# Patient Record
Sex: Male | Born: 1985 | Race: White | Hispanic: No | Marital: Single | Smoking: Former smoker
Health system: Southern US, Community
[De-identification: ages and names within clinical notes are randomized; demographics above are authoritative.]

## PROBLEM LIST (undated history)

## (undated) DIAGNOSIS — D649 Anemia, unspecified: Secondary | ICD-10-CM

## (undated) HISTORY — PX: OTHER SURGICAL HISTORY: SHX169

## (undated) HISTORY — PX: HERNIA REPAIR: SHX51

---

## 2017-03-10 ENCOUNTER — Ambulatory Visit (INDEPENDENT_AMBULATORY_CARE_PROVIDER_SITE_OTHER): Payer: BLUE CROSS/BLUE SHIELD | Admitting: Orthopaedic Surgery

## 2017-03-10 ENCOUNTER — Encounter (INDEPENDENT_AMBULATORY_CARE_PROVIDER_SITE_OTHER): Payer: Self-pay | Admitting: Orthopaedic Surgery

## 2017-03-10 ENCOUNTER — Other Ambulatory Visit (INDEPENDENT_AMBULATORY_CARE_PROVIDER_SITE_OTHER): Payer: Self-pay

## 2017-03-10 VITALS — BP 100/63 | HR 65 | Ht 69.0 in | Wt 150.0 lb

## 2017-03-10 DIAGNOSIS — M79671 Pain in right foot: Secondary | ICD-10-CM

## 2017-03-10 NOTE — Progress Notes (Signed)
   Office Visit Note   Patient: Derrick Baker           Date of Birth: [birth year]           MRN: [id number] Visit Date: 03/10/2017              Requested by: Louie Bostonapper, David B., MD [address] [place], [place] [id number] PCP: Louie Bostonapper, David B., MD   Assessment & Plan: Visit Diagnoses:  1. Right foot pain   Nondisplaced Jones fracture base of right fifth metatarsal We will place in equalizer boot. Maintain out of work until this Sunday and follow-up in 2 weeks with repeat films. Discussed the nature of the fracture and what she may expect over the next 6-8 weeks  Follow-Up Instructions: Return in about 2 weeks (around 03/24/2017), or if symptoms worsen or fail to improve.   Orders:  No orders of the defined types were placed in this encounter.  No orders of the defined types were placed in this encounter.     Procedures: No procedures performed   Clinical Data: No additional findings.   Subjective: Chief Complaint  Patient presents with  . Right Foot - Pain, Fracture    Derrick Baker is a 31 y o that presents with R Foot fx when he dropped a weight on it Sunday. [facility].  Derrick Baker is 31 years old and had somewhat insidious onset of pain in his right foot several weeks ago after cutting the grass. He had more acute pain over the weekend when he dropped an object on his right foot. He was seen in the emergency room at [facility] with x-rays consistent with a Jones fracture. He was given a note to stay out of work until he was reevaluated and placed in a wooden shoe. Presently he notes he fairly comfortable. Patient is a nonsmoker I did review his films on the PACS system many has a nondisplaced true Jones fracture.  HPI  Review of Systems   Objective: Vital Signs: BP 100/63   Pulse 65   Ht 5\' 9"  (1.753 m)   Wt 150 lb (68 kg)   BMI 22.15 kg/m   Physical Exam  Ortho Exam right foot with minimal swelling at the base of the fifth metatarsal.  Mild discomfort to palpation in the same area. Skin intact. Neurovascular exam intact. No ankle pain.  Specialty Comments:  No specialty comments available.  Imaging: No results found.   PMFS History: There are no active problems to display for this patient.  No past medical history on file.  No family history on file.  Past Surgical History:  Procedure Laterality Date  . HERNIA REPAIR    . OTHER SURGICAL HISTORY    . OTHER SURGICAL HISTORY     Social History   Occupational History  . Not on file.   Social History Main Topics  . Smoking status: Never Smoker  . Smokeless tobacco: Never Used  . Alcohol use 0.6 oz/week    1 Glasses of wine per week  . Drug use: No  . Sexual activity: Not on file     Valeria BatmanPeter W Izel Hochberg, MD   Note - This record has been created using [employer] software.  Chart creation errors have been sought, but may not always  have been located. Such creation errors do not reflect on  the standard of medical care.

## 2017-03-11 ENCOUNTER — Telehealth: Payer: Self-pay

## 2017-03-11 NOTE — Telephone Encounter (Signed)
Patient would like a note to release him back to work with NO restrictions.  Please Advise.  CB# is [phone].

## 2017-03-11 NOTE — Telephone Encounter (Signed)
Ok to do

## 2017-03-11 NOTE — Telephone Encounter (Signed)
Derrick Baker faxed note per pt

## 2017-03-11 NOTE — Telephone Encounter (Signed)
OK 

## 2017-03-31 ENCOUNTER — Ambulatory Visit (INDEPENDENT_AMBULATORY_CARE_PROVIDER_SITE_OTHER): Payer: Self-pay

## 2017-03-31 ENCOUNTER — Ambulatory Visit (INDEPENDENT_AMBULATORY_CARE_PROVIDER_SITE_OTHER): Payer: BLUE CROSS/BLUE SHIELD | Admitting: Orthopaedic Surgery

## 2017-03-31 DIAGNOSIS — M79671 Pain in right foot: Secondary | ICD-10-CM

## 2017-03-31 NOTE — Progress Notes (Signed)
   Office Visit Note   Patient: Derrick Baker           Date of Birth: [birth year]           MRN: [id number] Visit Date: 03/31/2017              Requested by: Louie Bostonapper, David B., MD [address] [place], [place] [id number] PCP: Louie Bostonapper, David B., MD   Assessment & Plan: Visit Diagnoses:  1. Right foot pain   Jones fracture fifth metatarsal right foot  Plan: Continue immobilization with either the wooden shoe wear the equalizer boot. Return to the office in 3 weeks for repeat films. Okay to continue working  Follow-Up Instructions: Return in about 3 weeks (around 04/21/2017).   Orders:  No orders of the defined types were placed in this encounter.  No orders of the defined types were placed in this encounter.     Procedures: No procedures performed   Clinical Data: No additional findings.   Subjective: No chief complaint on file. Mr. Derrick Baker is about 3 weeks status post nondisplaced Jones fracture base of the right fifth metatarsal. 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 He has been using a wooden shoe. Surprisingly he's doing quite well without a  "whole lot of pain". Denies any numbness or tingling or significant swelling.  HPI  Review of Systems   Objective: Vital Signs: There were no vitals taken for this visit.  Physical Exam  Ortho Exam exam in his shoe reveals very minimal tenderness at the base of the right fifth metatarsal. He walks without a limp. Able to get up and down easily from low seating position without a problem.  Specialty Comments:  No specialty comments available.  Imaging: No results found.   PMFS History: There are no active problems to display for this patient.  No past medical history on file.  No family history on file.  Past Surgical History:  Procedure Laterality Date  . HERNIA REPAIR    . OTHER SURGICAL HISTORY    . OTHER SURGICAL HISTORY      Social History   Occupational History  . Not on file.   Social History Main Topics  . Smoking status: Never Smoker  . Smokeless tobacco: Never Used  . Alcohol use 0.6 oz/week    1 Glasses of wine per week  . Drug use: No  . Sexual activity: Not on file     Valeria BatmanPeter W Laddie Naeem, MD   Note - This record has been created using [employer] software.  Chart creation errors have been sought, but may not always  have been located. Such creation errors do not reflect on  the standard of medical care.

## 2017-04-21 ENCOUNTER — Ambulatory Visit (INDEPENDENT_AMBULATORY_CARE_PROVIDER_SITE_OTHER): Payer: BLUE CROSS/BLUE SHIELD | Admitting: Orthopaedic Surgery

## 2019-12-10 ENCOUNTER — Inpatient Hospital Stay (HOSPITAL_COMMUNITY): Payer: BC Managed Care – PPO | Admitting: Anesthesiology

## 2019-12-10 ENCOUNTER — Emergency Department (HOSPITAL_COMMUNITY): Payer: BC Managed Care – PPO

## 2019-12-10 ENCOUNTER — Encounter (HOSPITAL_COMMUNITY): Admission: EM | Disposition: A | Discharge status: Home Health | Payer: Self-pay | Source: Home / Self Care

## 2019-12-10 ENCOUNTER — Inpatient Hospital Stay (HOSPITAL_COMMUNITY)
Admission: EM | Admit: 2019-12-10 | Discharge: 2019-12-18 | DRG: 957 | Disposition: A | Discharge status: Home Health | Payer: BC Managed Care – PPO | Attending: General Surgery | Admitting: General Surgery

## 2019-12-10 ENCOUNTER — Other Ambulatory Visit: Payer: Self-pay

## 2019-12-10 ENCOUNTER — Encounter (HOSPITAL_COMMUNITY): Payer: Self-pay | Admitting: Radiology

## 2019-12-10 DIAGNOSIS — S27321A Contusion of lung, unilateral, initial encounter: Secondary | ICD-10-CM | POA: Diagnosis present

## 2019-12-10 DIAGNOSIS — S82121C Displaced fracture of lateral condyle of right tibia, initial encounter for open fracture type IIIA, IIIB, or IIIC: Secondary | ICD-10-CM | POA: Diagnosis present

## 2019-12-10 DIAGNOSIS — S82491C Other fracture of shaft of right fibula, initial encounter for open fracture type IIIA, IIIB, or IIIC: Secondary | ICD-10-CM | POA: Diagnosis present

## 2019-12-10 DIAGNOSIS — S92515A Nondisplaced fracture of proximal phalanx of left lesser toe(s), initial encounter for closed fracture: Secondary | ICD-10-CM | POA: Diagnosis present

## 2019-12-10 DIAGNOSIS — S82001C Unspecified fracture of right patella, initial encounter for open fracture type IIIA, IIIB, or IIIC: Secondary | ICD-10-CM | POA: Diagnosis present

## 2019-12-10 DIAGNOSIS — F10120 Alcohol abuse with intoxication, uncomplicated: Secondary | ICD-10-CM | POA: Diagnosis present

## 2019-12-10 DIAGNOSIS — D62 Acute posthemorrhagic anemia: Secondary | ICD-10-CM | POA: Diagnosis not present

## 2019-12-10 DIAGNOSIS — Z23 Encounter for immunization: Secondary | ICD-10-CM

## 2019-12-10 DIAGNOSIS — M898X9 Other specified disorders of bone, unspecified site: Secondary | ICD-10-CM | POA: Diagnosis present

## 2019-12-10 DIAGNOSIS — R609 Edema, unspecified: Secondary | ICD-10-CM

## 2019-12-10 DIAGNOSIS — S82191C Other fracture of upper end of right tibia, initial encounter for open fracture type IIIA, IIIB, or IIIC: Secondary | ICD-10-CM

## 2019-12-10 DIAGNOSIS — Y92410 Unspecified street and highway as the place of occurrence of the external cause: Secondary | ICD-10-CM | POA: Diagnosis not present

## 2019-12-10 DIAGNOSIS — Z880 Allergy status to penicillin: Secondary | ICD-10-CM

## 2019-12-10 DIAGNOSIS — S12601A Unspecified nondisplaced fracture of seventh cervical vertebra, initial encounter for closed fracture: Secondary | ICD-10-CM | POA: Diagnosis present

## 2019-12-10 DIAGNOSIS — Z419 Encounter for procedure for purposes other than remedying health state, unspecified: Secondary | ICD-10-CM

## 2019-12-10 DIAGNOSIS — S92301A Fracture of unspecified metatarsal bone(s), right foot, initial encounter for closed fracture: Secondary | ICD-10-CM | POA: Diagnosis present

## 2019-12-10 DIAGNOSIS — S7290XA Unspecified fracture of unspecified femur, initial encounter for closed fracture: Secondary | ICD-10-CM | POA: Diagnosis present

## 2019-12-10 DIAGNOSIS — S52271A Monteggia's fracture of right ulna, initial encounter for closed fracture: Secondary | ICD-10-CM | POA: Diagnosis present

## 2019-12-10 DIAGNOSIS — S92324A Nondisplaced fracture of second metatarsal bone, right foot, initial encounter for closed fracture: Secondary | ICD-10-CM | POA: Diagnosis present

## 2019-12-10 DIAGNOSIS — F10920 Alcohol use, unspecified with intoxication, uncomplicated: Secondary | ICD-10-CM

## 2019-12-10 DIAGNOSIS — S8991XA Unspecified injury of right lower leg, initial encounter: Secondary | ICD-10-CM | POA: Diagnosis not present

## 2019-12-10 DIAGNOSIS — E559 Vitamin D deficiency, unspecified: Secondary | ICD-10-CM | POA: Diagnosis present

## 2019-12-10 DIAGNOSIS — R52 Pain, unspecified: Secondary | ICD-10-CM

## 2019-12-10 DIAGNOSIS — T1490XA Injury, unspecified, initial encounter: Secondary | ICD-10-CM

## 2019-12-10 DIAGNOSIS — S129XXA Fracture of neck, unspecified, initial encounter: Secondary | ICD-10-CM

## 2019-12-10 DIAGNOSIS — U071 COVID-19: Secondary | ICD-10-CM | POA: Diagnosis present

## 2019-12-10 DIAGNOSIS — S52091A Other fracture of upper end of right ulna, initial encounter for closed fracture: Secondary | ICD-10-CM

## 2019-12-10 DIAGNOSIS — Y908 Blood alcohol level of 240 mg/100 ml or more: Secondary | ICD-10-CM | POA: Diagnosis present

## 2019-12-10 DIAGNOSIS — S80212A Abrasion, left knee, initial encounter: Secondary | ICD-10-CM | POA: Diagnosis present

## 2019-12-10 DIAGNOSIS — S2241XA Multiple fractures of ribs, right side, initial encounter for closed fracture: Secondary | ICD-10-CM | POA: Diagnosis present

## 2019-12-10 DIAGNOSIS — T148XXA Other injury of unspecified body region, initial encounter: Secondary | ICD-10-CM

## 2019-12-10 DIAGNOSIS — J939 Pneumothorax, unspecified: Secondary | ICD-10-CM

## 2019-12-10 DIAGNOSIS — R339 Retention of urine, unspecified: Secondary | ICD-10-CM | POA: Diagnosis not present

## 2019-12-10 DIAGNOSIS — S72414C Nondisplaced unspecified condyle fracture of lower end of right femur, initial encounter for open fracture type IIIA, IIIB, or IIIC: Secondary | ICD-10-CM

## 2019-12-10 DIAGNOSIS — S270XXA Traumatic pneumothorax, initial encounter: Principal | ICD-10-CM

## 2019-12-10 DIAGNOSIS — Z4682 Encounter for fitting and adjustment of non-vascular catheter: Secondary | ICD-10-CM

## 2019-12-10 DIAGNOSIS — S82831C Other fracture of upper and lower end of right fibula, initial encounter for open fracture type IIIA, IIIB, or IIIC: Secondary | ICD-10-CM

## 2019-12-10 DIAGNOSIS — S82001B Unspecified fracture of right patella, initial encounter for open fracture type I or II: Secondary | ICD-10-CM | POA: Diagnosis present

## 2019-12-10 DIAGNOSIS — S82091C Other fracture of right patella, initial encounter for open fracture type IIIA, IIIB, or IIIC: Secondary | ICD-10-CM

## 2019-12-10 HISTORY — PX: ORIF ULNAR FRACTURE: SHX5417

## 2019-12-10 HISTORY — PX: I & D EXTREMITY: SHX5045

## 2019-12-10 HISTORY — PX: CLOSED REDUCTION RADIAL SHAFT: SHX5008

## 2019-12-10 LAB — COMPREHENSIVE METABOLIC PANEL
ALT: 342 U/L — ABNORMAL HIGH (ref 0–44)
AST: 467 U/L — ABNORMAL HIGH (ref 15–41)
Albumin: 3.5 g/dL (ref 3.5–5.0)
Alkaline Phosphatase: 69 U/L (ref 38–126)
Anion gap: 14 (ref 5–15)
BUN: 11 mg/dL (ref 6–20)
CO2: 23 mmol/L (ref 22–32)
Calcium: 8.3 mg/dL — ABNORMAL LOW (ref 8.9–10.3)
Chloride: 104 mmol/L (ref 98–111)
Creatinine, Ser: 0.95 mg/dL (ref 0.61–1.24)
GFR calc Af Amer: >60 mL/min (ref >60)
GFR calc non Af Amer: >60 mL/min (ref >60)
Glucose, Bld: 143 mg/dL — ABNORMAL HIGH (ref 70–99)
Potassium: 3.6 mmol/L (ref 3.5–5.1)
Sodium: 141 mmol/L (ref 135–145)
Total Bilirubin: 0.4 mg/dL (ref 0.3–1.2)
Total Protein: 6.5 g/dL (ref 6.5–8.1)

## 2019-12-10 LAB — LACTIC ACID, PLASMA
Lactic Acid, Venous: 3.4 mmol/L (ref 0.5–1.9)
Lactic Acid, Venous: 3.4 mmol/L (ref 0.5–1.9)

## 2019-12-10 LAB — CBC
HCT: 41 % (ref 39.0–52.0)
Hemoglobin: 13.6 g/dL (ref 13.0–17.0)
MCH: 29.3 pg (ref 26.0–34.0)
MCHC: 33.2 g/dL (ref 30.0–36.0)
MCV: 88.4 fL (ref 80.0–100.0)
Platelets: 247 10*3/uL (ref 150–400)
RBC: 4.64 MIL/uL (ref 4.22–5.81)
RDW: 12.6 % (ref 11.5–15.5)
WBC: 15.8 10*3/uL — ABNORMAL HIGH (ref 4.0–10.5)
nRBC: 0 % (ref 0.0–0.2)

## 2019-12-10 LAB — I-STAT CHEM 8, ED
BUN: 11 mg/dL (ref 6–20)
Calcium, Ion: 1.01 mmol/L — ABNORMAL LOW (ref 1.15–1.40)
Chloride: 104 mmol/L (ref 98–111)
Creatinine, Ser: 1.2 mg/dL (ref 0.61–1.24)
Glucose, Bld: 136 mg/dL — ABNORMAL HIGH (ref 70–99)
HCT: 38 % — ABNORMAL LOW (ref 39.0–52.0)
Hemoglobin: 12.9 g/dL — ABNORMAL LOW (ref 13.0–17.0)
Potassium: 3.6 mmol/L (ref 3.5–5.1)
Sodium: 142 mmol/L (ref 135–145)
TCO2: 26 mmol/L (ref 22–32)

## 2019-12-10 LAB — RESPIRATORY PANEL BY RT PCR (FLU A&B, COVID)
Influenza A by PCR: NEGATIVE
Influenza B by PCR: NEGATIVE
SARS Coronavirus 2 by RT PCR: POSITIVE — AB

## 2019-12-10 LAB — ETHANOL: Alcohol, Ethyl (B): 269 mg/dL — ABNORMAL HIGH (ref <10)

## 2019-12-10 LAB — SAMPLE TO BLOOD BANK

## 2019-12-10 LAB — PROTIME-INR
INR: 1 (ref 0.8–1.2)
Prothrombin Time: 13.3 seconds (ref 11.4–15.2)

## 2019-12-10 LAB — HIV ANTIBODY (ROUTINE TESTING W REFLEX): HIV Screen 4th Generation wRfx: NONREACTIVE

## 2019-12-10 SURGERY — OPEN REDUCTION INTERNAL FIXATION (ORIF) ULNAR FRACTURE
Anesthesia: General | Laterality: Right

## 2019-12-10 MED ORDER — FENTANYL CITRATE (PF) 100 MCG/2ML IJ SOLN
50.0000 ug | Freq: Once | INTRAMUSCULAR | Status: AC
  Administered 2019-12-10: 16:00:00 50 ug via INTRAVENOUS
  Filled 2019-12-10: qty 2

## 2019-12-10 MED ORDER — TETANUS-DIPHTH-ACELL PERTUSSIS 5-2.5-18.5 LF-MCG/0.5 IM SUSP
0.5000 mL | Freq: Once | INTRAMUSCULAR | Status: AC
  Administered 2019-12-10: 0.5 mL via INTRAMUSCULAR

## 2019-12-10 MED ORDER — ONDANSETRON HCL 4 MG/2ML IJ SOLN
INTRAMUSCULAR | Status: DC | PRN
  Administered 2019-12-10: 4 mg via INTRAVENOUS

## 2019-12-10 MED ORDER — SODIUM CHLORIDE 0.9 % IV BOLUS
1000.0000 mL | Freq: Once | INTRAVENOUS | Status: AC
  Administered 2019-12-10: 1000 mL via INTRAVENOUS

## 2019-12-10 MED ORDER — METHOCARBAMOL 500 MG PO TABS
1000.0000 mg | ORAL_TABLET | Freq: Three times a day (TID) | ORAL | Status: DC
  Administered 2019-12-11 – 2019-12-18 (×22): 1000 mg via ORAL
  Filled 2019-12-10 (×22): qty 2

## 2019-12-10 MED ORDER — IOHEXOL 300 MG/ML  SOLN
100.0000 mL | Freq: Once | INTRAMUSCULAR | Status: AC | PRN
  Administered 2019-12-10: 100 mL via INTRAVENOUS

## 2019-12-10 MED ORDER — PHENYLEPHRINE HCL-NACL 10-0.9 MG/250ML-% IV SOLN
INTRAVENOUS | Status: DC | PRN
  Administered 2019-12-10: 25 ug/min via INTRAVENOUS

## 2019-12-10 MED ORDER — LACTATED RINGERS IV SOLN: INTRAVENOUS | Status: DC | PRN

## 2019-12-10 MED ORDER — ENOXAPARIN SODIUM 30 MG/0.3ML ~~LOC~~ SOLN
30.0000 mg | Freq: Two times a day (BID) | SUBCUTANEOUS | Status: DC
  Administered 2019-12-11 – 2019-12-17 (×14): 30 mg via SUBCUTANEOUS
  Filled 2019-12-10 (×15): qty 0.3

## 2019-12-10 MED ORDER — LACTATED RINGERS IV BOLUS
1000.0000 mL | Freq: Once | INTRAVENOUS | Status: AC
  Administered 2019-12-11: 1000 mL via INTRAVENOUS

## 2019-12-10 MED ORDER — KETOROLAC TROMETHAMINE 15 MG/ML IJ SOLN
15.0000 mg | Freq: Four times a day (QID) | INTRAMUSCULAR | Status: AC | PRN
  Administered 2019-12-11 – 2019-12-15 (×12): 15 mg via INTRAVENOUS
  Filled 2019-12-10 (×12): qty 1

## 2019-12-10 MED ORDER — ALBUMIN HUMAN 5 % IV SOLN: INTRAVENOUS | Status: DC | PRN

## 2019-12-10 MED ORDER — ACETAMINOPHEN 500 MG PO TABS
1000.0000 mg | ORAL_TABLET | Freq: Four times a day (QID) | ORAL | Status: DC
  Administered 2019-12-11 – 2019-12-18 (×28): 1000 mg via ORAL
  Filled 2019-12-10 (×29): qty 2

## 2019-12-10 MED ORDER — SODIUM CHLORIDE 0.9 % IV SOLN
2.0000 g | INTRAVENOUS | Status: AC
  Administered 2019-12-10: 21:00:00 2 g via INTRAVENOUS
  Filled 2019-12-10: qty 2
  Filled 2019-12-10: qty 20

## 2019-12-10 MED ORDER — ROCURONIUM BROMIDE 50 MG/5ML IV SOSY
PREFILLED_SYRINGE | INTRAVENOUS | Status: DC | PRN
  Administered 2019-12-10 (×2): 20 mg via INTRAVENOUS
  Administered 2019-12-10: 30 mg via INTRAVENOUS
  Administered 2019-12-10: 20 mg via INTRAVENOUS
  Administered 2019-12-10: 50 mg via INTRAVENOUS
  Administered 2019-12-10: 20 mg via INTRAVENOUS

## 2019-12-10 MED ORDER — SODIUM CHLORIDE 0.9 % IV SOLN: INTRAVENOUS | Status: DC

## 2019-12-10 MED ORDER — FENTANYL CITRATE (PF) 250 MCG/5ML IJ SOLN
INTRAMUSCULAR | Status: AC
  Filled 2019-12-10: qty 5

## 2019-12-10 MED ORDER — MORPHINE SULFATE (PF) 4 MG/ML IV SOLN
4.0000 mg | INTRAVENOUS | Status: DC | PRN
  Administered 2019-12-11: 2 mg via INTRAVENOUS
  Administered 2019-12-11: 1 mg via INTRAVENOUS
  Administered 2019-12-11 (×2): 4 mg via INTRAVENOUS
  Administered 2019-12-11: 1 mg via INTRAVENOUS
  Administered 2019-12-11 – 2019-12-18 (×16): 4 mg via INTRAVENOUS
  Filled 2019-12-10 (×19): qty 1

## 2019-12-10 MED ORDER — SUCCINYLCHOLINE CHLORIDE 20 MG/ML IJ SOLN
INTRAMUSCULAR | Status: DC | PRN
  Administered 2019-12-10: 100 mg via INTRAVENOUS

## 2019-12-10 MED ORDER — PROPOFOL 10 MG/ML IV BOLUS
INTRAVENOUS | Status: DC | PRN
  Administered 2019-12-10: 150 mg via INTRAVENOUS

## 2019-12-10 MED ORDER — DEXAMETHASONE SODIUM PHOSPHATE 10 MG/ML IJ SOLN
INTRAMUSCULAR | Status: DC | PRN
  Administered 2019-12-10: 5 mg via INTRAVENOUS

## 2019-12-10 MED ORDER — ETOMIDATE 2 MG/ML IV SOLN
10.0000 mg | Freq: Once | INTRAVENOUS | Status: AC
  Administered 2019-12-10: 10 mg via INTRAVENOUS
  Filled 2019-12-10: qty 10

## 2019-12-10 MED ORDER — 0.9 % SODIUM CHLORIDE (POUR BTL) OPTIME
TOPICAL | Status: DC | PRN
  Administered 2019-12-10: 1000 mL

## 2019-12-10 MED ORDER — DOCUSATE SODIUM 100 MG PO CAPS
100.0000 mg | ORAL_CAPSULE | Freq: Two times a day (BID) | ORAL | Status: DC
  Administered 2019-12-11 – 2019-12-18 (×14): 100 mg via ORAL
  Filled 2019-12-10 (×14): qty 1

## 2019-12-10 MED ORDER — ONDANSETRON HCL 4 MG/2ML IJ SOLN
4.0000 mg | Freq: Once | INTRAMUSCULAR | Status: AC
  Administered 2019-12-10: 4 mg via INTRAVENOUS
  Filled 2019-12-10: qty 2

## 2019-12-10 MED ORDER — ONDANSETRON HCL 4 MG/2ML IJ SOLN: 4.0000 mg | Freq: Four times a day (QID) | INTRAMUSCULAR | Status: DC | PRN

## 2019-12-10 MED ORDER — STERILE WATER FOR IRRIGATION IR SOLN
Status: DC | PRN
  Administered 2019-12-10 (×3): 1000 mL

## 2019-12-10 MED ORDER — ONDANSETRON 4 MG PO TBDP: 4.0000 mg | ORAL_TABLET | Freq: Four times a day (QID) | ORAL | Status: DC | PRN

## 2019-12-10 MED ORDER — CEFAZOLIN SODIUM-DEXTROSE 2-4 GM/100ML-% IV SOLN
2.0000 g | INTRAVENOUS | Status: AC
  Administered 2019-12-10: 2 g via INTRAVENOUS
  Filled 2019-12-10: qty 100

## 2019-12-10 MED ORDER — SODIUM CHLORIDE 0.9 % IR SOLN
Status: DC | PRN
  Administered 2019-12-10 (×3): 3000 mL

## 2019-12-10 MED ORDER — PROPOFOL 10 MG/ML IV BOLUS
INTRAVENOUS | Status: AC
  Filled 2019-12-10: qty 20

## 2019-12-10 MED ORDER — MIDAZOLAM HCL 2 MG/2ML IJ SOLN
INTRAMUSCULAR | Status: AC
  Filled 2019-12-10: qty 2

## 2019-12-10 MED ORDER — TETANUS-DIPHTH-ACELL PERTUSSIS 5-2.5-18.5 LF-MCG/0.5 IM SUSP
0.5000 mL | Freq: Once | INTRAMUSCULAR | Status: DC
  Filled 2019-12-10: qty 0.5

## 2019-12-10 MED ORDER — LACTATED RINGERS IV SOLN
INTRAVENOUS | Status: DC
  Administered 2019-12-12: 16:00:00 1000 mL via INTRAVENOUS

## 2019-12-10 MED ORDER — FENTANYL CITRATE (PF) 100 MCG/2ML IJ SOLN
INTRAMUSCULAR | Status: DC | PRN
  Administered 2019-12-10 – 2019-12-11 (×7): 50 ug via INTRAVENOUS

## 2019-12-10 MED ORDER — OXYCODONE HCL 5 MG/5ML PO SOLN
5.0000 mg | ORAL | Status: DC | PRN
  Administered 2019-12-11 – 2019-12-18 (×33): 10 mg via ORAL
  Filled 2019-12-10 (×33): qty 10

## 2019-12-10 MED ORDER — FENTANYL CITRATE (PF) 100 MCG/2ML IJ SOLN
50.0000 ug | Freq: Once | INTRAMUSCULAR | Status: AC
  Administered 2019-12-10: 18:00:00 50 ug via INTRAVENOUS
  Filled 2019-12-10: qty 2

## 2019-12-10 SURGICAL SUPPLY — 115 items
BIT DRILL 2.3 QUICK RELEASE (BIT) ×1 IMPLANT
BIT DRILL 2.8 QUICK RELEASE (BIT) ×1 IMPLANT
BLADE CLIPPER SURG (BLADE) ×3 IMPLANT
BLADE SURG 10 STRL SS (BLADE) ×6 IMPLANT
BNDG COHESIVE 4X5 TAN STRL (GAUZE/BANDAGES/DRESSINGS) ×3 IMPLANT
BNDG COHESIVE 6X5 TAN STRL LF (GAUZE/BANDAGES/DRESSINGS) ×3 IMPLANT
BNDG ELASTIC 3X5.8 VLCR STR LF (GAUZE/BANDAGES/DRESSINGS) ×3 IMPLANT
BNDG ELASTIC 4X5.8 VLCR STR LF (GAUZE/BANDAGES/DRESSINGS) ×6 IMPLANT
BNDG ELASTIC 6X5.8 VLCR STR LF (GAUZE/BANDAGES/DRESSINGS) ×3 IMPLANT
BNDG ESMARK 4X9 LF (GAUZE/BANDAGES/DRESSINGS) ×3 IMPLANT
BNDG GAUZE ELAST 4 BULKY (GAUZE/BANDAGES/DRESSINGS) ×6 IMPLANT
BRUSH SCRUB EZ  4% CHG (MISCELLANEOUS) ×6
BRUSH SCRUB EZ 4% CHG (MISCELLANEOUS) ×3 IMPLANT
BRUSH SCRUB EZ PLAIN DRY (MISCELLANEOUS) ×6 IMPLANT
BUCKET CAST 5QT PAPER WAX WHT (CAST SUPPLIES) ×3 IMPLANT
COVER SURGICAL LIGHT HANDLE (MISCELLANEOUS) ×6 IMPLANT
COVER WAND RF STERILE (DRAPES) IMPLANT
DECANTER SPIKE VIAL GLASS SM (MISCELLANEOUS) IMPLANT
DRAPE C-ARM 42X72 X-RAY (DRAPES) ×3 IMPLANT
DRAPE C-ARMOR (DRAPES) ×3 IMPLANT
DRAPE HALF SHEET 40X57 (DRAPES) ×3 IMPLANT
DRAPE OEC MINIVIEW 54X84 (DRAPES) ×3 IMPLANT
DRAPE U-SHAPE 47X51 STRL (DRAPES) ×3 IMPLANT
DRILL 2.3 QUICK RELEASE (BIT) ×3
DRILL 2.8 QUICK RELEASE (BIT) ×3
DRSG ADAPTIC 3X8 NADH LF (GAUZE/BANDAGES/DRESSINGS) ×3 IMPLANT
DRSG EMULSION OIL 3X3 NADH (GAUZE/BANDAGES/DRESSINGS) ×3 IMPLANT
DRSG MEPITEL 4X7.2 (GAUZE/BANDAGES/DRESSINGS) ×6 IMPLANT
DRSG PAD ABDOMINAL 8X10 ST (GAUZE/BANDAGES/DRESSINGS) ×3 IMPLANT
DRSG VERAFLO VAC MED (GAUZE/BANDAGES/DRESSINGS) ×3 IMPLANT
DRSG VERSA FOAM LRG 10X15 (GAUZE/BANDAGES/DRESSINGS) ×3 IMPLANT
ELECT BLADE 4.0 EZ CLEAN MEGAD (MISCELLANEOUS) ×3
ELECT REM PT RETURN 9FT ADLT (ELECTROSURGICAL) ×3
ELECTRODE BLDE 4.0 EZ CLN MEGD (MISCELLANEOUS) ×1 IMPLANT
ELECTRODE REM PT RTRN 9FT ADLT (ELECTROSURGICAL) ×1 IMPLANT
EVACUATOR 1/8 PVC DRAIN (DRAIN) ×3 IMPLANT
GAUZE SPONGE 4X4 12PLY STRL (GAUZE/BANDAGES/DRESSINGS) ×6 IMPLANT
GLOVE BIO SURGEON STRL SZ7 (GLOVE) ×3 IMPLANT
GLOVE BIO SURGEON STRL SZ7.5 (GLOVE) ×9 IMPLANT
GLOVE BIO SURGEON STRL SZ8 (GLOVE) ×9 IMPLANT
GLOVE BIOGEL PI IND STRL 7.0 (GLOVE) ×1 IMPLANT
GLOVE BIOGEL PI IND STRL 7.5 (GLOVE) ×2 IMPLANT
GLOVE BIOGEL PI IND STRL 8 (GLOVE) ×2 IMPLANT
GLOVE BIOGEL PI IND STRL 9 (GLOVE) ×1 IMPLANT
GLOVE BIOGEL PI INDICATOR 7.0 (GLOVE) ×2
GLOVE BIOGEL PI INDICATOR 7.5 (GLOVE) ×4
GLOVE BIOGEL PI INDICATOR 8 (GLOVE) ×4
GLOVE BIOGEL PI INDICATOR 9 (GLOVE) ×2
GOWN STRL REUS W/ TWL LRG LVL3 (GOWN DISPOSABLE) ×2 IMPLANT
GOWN STRL REUS W/ TWL XL LVL3 (GOWN DISPOSABLE) ×1 IMPLANT
GOWN STRL REUS W/TWL LRG LVL3 (GOWN DISPOSABLE) ×4
GOWN STRL REUS W/TWL XL LVL3 (GOWN DISPOSABLE) ×2
GUIDEWIRE ORTH 6X062XTROC NS (WIRE) ×2 IMPLANT
GUIDEWIRE ORTHO 2.0X9 ST (WIRE) ×3 IMPLANT
HANDPIECE INTERPULSE COAX TIP (DISPOSABLE)
IMMOBILIZER KNEE 22 UNIV (SOFTGOODS) ×3 IMPLANT
K-WIRE .062 (WIRE) ×4
KIT BASIN OR (CUSTOM PROCEDURE TRAY) ×3 IMPLANT
KIT TURNOVER KIT B (KITS) ×3 IMPLANT
MANIFOLD NEPTUNE II (INSTRUMENTS) ×3 IMPLANT
NEEDLE HYPO 25GX1X1/2 BEV (NEEDLE) IMPLANT
NS IRRIG 1000ML POUR BTL (IV SOLUTION) ×6 IMPLANT
PACK ORTHO EXTREMITY (CUSTOM PROCEDURE TRAY) ×3 IMPLANT
PAD ARMBOARD 7.5X6 YLW CONV (MISCELLANEOUS) ×6 IMPLANT
PAD CAST 3X4 CTTN HI CHSV (CAST SUPPLIES) ×1 IMPLANT
PAD CAST 4YDX4 CTTN HI CHSV (CAST SUPPLIES) ×1 IMPLANT
PADDING CAST COTTON 3X4 STRL (CAST SUPPLIES) ×2
PADDING CAST COTTON 4X4 STRL (CAST SUPPLIES) ×2
PADDING CAST COTTON 6X4 STRL (CAST SUPPLIES) ×3 IMPLANT
PLATE OLECRANON RT 110 7H (Plate) ×1 IMPLANT
PLATE OLECRANON RT 7H (Plate) ×3 IMPLANT
SCREW 3.5X45MM (Screw) ×3 IMPLANT
SCREW HEX LOCK 2.7X16MM (Screw) ×6 IMPLANT
SCREW HEX NON LOCK 3.5X34MM (Screw) ×3 IMPLANT
SCREW HEXALOBE NON-LOCK 3.5X16 (Screw) ×3 IMPLANT
SCREW LOCK 18X2.7X HEXALOBE (Screw) ×2 IMPLANT
SCREW LOCK 20X2.7X HEXALOBE (Screw) ×1 IMPLANT
SCREW LOCK 22X2.7X HEXALOBE (Screw) ×1 IMPLANT
SCREW LOCKING 2.7X18MM (Screw) ×4 IMPLANT
SCREW LOCKING 2.7X20MM (Screw) ×2 IMPLANT
SCREW LOCKING 2.7X22MM (Screw) ×2 IMPLANT
SCREW LOCKING 3.0X34MM (Screw) ×3 IMPLANT
SCREW LOCKING 3.5X45MM (Screw) ×3 IMPLANT
SCREW NON LOCKING HEX 3.5X18MM (Screw) ×6 IMPLANT
SET HNDPC FAN SPRY TIP SCT (DISPOSABLE) IMPLANT
SLING ARM IMMOBILIZER LRG (SOFTGOODS) ×3 IMPLANT
SOL PREP POV-IOD 4OZ 10% (MISCELLANEOUS) ×3 IMPLANT
SOL PREP PROV IODINE SCRUB 4OZ (MISCELLANEOUS) ×3 IMPLANT
SOLUTION EZ SCRUB CHG (MISCELLANEOUS) ×3 IMPLANT
SPLINT PLASTER CAST XFAST 5X30 (CAST SUPPLIES) ×1 IMPLANT
SPLINT PLASTER XFAST SET 5X30 (CAST SUPPLIES) ×2
SPONGE LAP 18X18 RF (DISPOSABLE) ×6 IMPLANT
STAPLER VISISTAT 35W (STAPLE) ×6 IMPLANT
STOCKINETTE IMPERVIOUS 9X36 MD (GAUZE/BANDAGES/DRESSINGS) IMPLANT
SUCTION FRAZIER HANDLE 10FR (MISCELLANEOUS) ×2
SUCTION FRAZIER TIP 8 FR DISP (SUCTIONS) ×2
SUCTION TUBE FRAZIER 10FR DISP (MISCELLANEOUS) ×1 IMPLANT
SUCTION TUBE FRAZIER 8FR DISP (SUCTIONS) ×1 IMPLANT
SUT ETHILON 2 0 PSLX (SUTURE) ×9 IMPLANT
SUT PDS AB 0 CT 36 (SUTURE) ×3 IMPLANT
SUT PDS AB 2-0 CT1 27 (SUTURE) IMPLANT
SUT PROLENE 0 CT (SUTURE) IMPLANT
SUT VIC AB 0 CT1 27 (SUTURE) ×4
SUT VIC AB 0 CT1 27XBRD ANBCTR (SUTURE) ×2 IMPLANT
SUT VIC AB 2-0 CT1 27 (SUTURE) ×6
SUT VIC AB 2-0 CT1 TAPERPNT 27 (SUTURE) ×3 IMPLANT
SUT VIC AB 2-0 CT3 27 (SUTURE) IMPLANT
SYR CONTROL 10ML LL (SYRINGE) IMPLANT
TOWEL GREEN STERILE (TOWEL DISPOSABLE) ×6 IMPLANT
TOWEL GREEN STERILE FF (TOWEL DISPOSABLE) ×3 IMPLANT
TUBE CONNECTING 12'X1/4 (SUCTIONS) ×3
TUBE CONNECTING 12X1/4 (SUCTIONS) ×6 IMPLANT
UNDERPAD 30X30 (UNDERPADS AND DIAPERS) ×9 IMPLANT
WATER STERILE IRR 1000ML POUR (IV SOLUTION) ×6 IMPLANT
YANKAUER SUCT BULB TIP NO VENT (SUCTIONS) ×6 IMPLANT
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
## 2019-12-10 NOTE — Anesthesia Preprocedure Evaluation (Signed)
Anesthesia Evaluation  Patient identified by MRN, date of birth, ID band Patient awake    Reviewed: Allergy & Precautions, NPO status , Patient's Chart, lab work & pertinent test results  Airway Mallampati: II  TM Distance: >3 FB Neck ROM: Full    Dental  (+) Dental Advisory Given   Pulmonary  Right ptx s/p chest tube insertion with right rib fx's   breath sounds clear to auscultation       Cardiovascular negative cardio ROS   Rhythm:Regular Rate:Normal     Neuro/Psych negative neurological ROS     GI/Hepatic negative GI ROS, (+)     substance abuse  alcohol use, Elevated LFT's   Endo/Other  negative endocrine ROS  Renal/GU negative Renal ROS     Musculoskeletal   Abdominal   Peds  Hematology  (+) anemia ,   Anesthesia Other Findings Covid +  Reproductive/Obstetrics                             Lab Results  Component Value Date   WBC 15.8 (H) 12/10/2019   HGB 12.9 (L) 12/10/2019   HCT 38.0 (L) 12/10/2019   MCV 88.4 12/10/2019   PLT 247 12/10/2019   Lab Results  Component Value Date   CREATININE 1.20 12/10/2019   BUN 11 12/10/2019   NA 142 12/10/2019   K 3.6 12/10/2019   CL 104 12/10/2019   CO2 23 12/10/2019    Anesthesia Physical Anesthesia Plan  ASA: III and emergent  Anesthesia Plan: General   Post-op Pain Management:    Induction: Intravenous and Rapid sequence  PONV Risk Score and Plan: 2 and Dexamethasone, Ondansetron and Treatment may vary due to age or medical condition  Airway Management Planned: Oral ETT and Video Laryngoscope Planned  Additional Equipment:   Intra-op Plan:   Post-operative Plan: Extubation in OR  Informed Consent: I have reviewed the patients History and Physical, chart, labs and discussed the procedure including the risks, benefits and alternatives for the proposed anesthesia with the patient or authorized representative who has  indicated his/her understanding and acceptance.     Dental advisory given  Plan Discussed with: CRNA  Anesthesia Plan Comments:         Anesthesia Quick Evaluation
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
## 2019-12-10 NOTE — Progress Notes (Signed)
Orthopedic Tech Progress Note Patient Details:  Derrick Baker 04/28/1986 [id number]  Ortho Devices Type of Ortho Device: Ace wrap, Knee Immobilizer Ortho Device/Splint Location: Level 2 Trauma Ortho Device/Splint Interventions: Application       Saul Fordyce 12/10/2019, 4:33 PM

## 2019-12-10 NOTE — ED Notes (Signed)
EDP speaking with neuro.

## 2019-12-10 NOTE — Anesthesia Procedure Notes (Signed)
Procedure Name: Intubation Date/Time: 12/10/2019 8:01 PM Performed by: Edmonia Caprio, CRNA Pre-anesthesia Checklist: Patient identified, Emergency Drugs available, Suction available, Patient being monitored and Timeout performed Patient Re-evaluated:Patient Re-evaluated prior to induction Oxygen Delivery Method: Circle system utilized Preoxygenation: Pre-oxygenation with 100% oxygen Induction Type: IV induction and Rapid sequence Laryngoscope Size: Glidescope and 4 Grade View: Grade I Tube type: Oral Tube size: 7.5 mm Number of attempts: 1 Airway Equipment and Method: Stylet and Video-laryngoscopy Placement Confirmation: ETT inserted through vocal cords under direct vision,  positive ETCO2 and breath sounds checked- equal and bilateral Secured at: 22 cm Tube secured with: Tape Dental Injury: Teeth and Oropharynx as per pre-operative assessment  Comments: Initially c-collar left in place for induction, unable to get blade in mouth with c-collar on; manual in-line stabilization held for DL. Dried blood present in mouth/on teeth.  Oropharynx clear on DL. C-collar immediately replaced.

## 2019-12-11 ENCOUNTER — Inpatient Hospital Stay (HOSPITAL_COMMUNITY): Payer: BC Managed Care – PPO

## 2019-12-11 ENCOUNTER — Encounter (HOSPITAL_COMMUNITY): Payer: Self-pay

## 2019-12-11 DIAGNOSIS — S92301A Fracture of unspecified metatarsal bone(s), right foot, initial encounter for closed fracture: Secondary | ICD-10-CM | POA: Diagnosis present

## 2019-12-11 DIAGNOSIS — S8991XA Unspecified injury of right lower leg, initial encounter: Secondary | ICD-10-CM | POA: Diagnosis present

## 2019-12-11 DIAGNOSIS — S52271A Monteggia's fracture of right ulna, initial encounter for closed fracture: Secondary | ICD-10-CM

## 2019-12-11 HISTORY — DX: Monteggia's fracture of right ulna, initial encounter for closed fracture: S52.271A

## 2019-12-11 LAB — COMPREHENSIVE METABOLIC PANEL
ALT: 223 U/L — ABNORMAL HIGH (ref 0–44)
AST: 257 U/L — ABNORMAL HIGH (ref 15–41)
Albumin: 3 g/dL — ABNORMAL LOW (ref 3.5–5.0)
Alkaline Phosphatase: 47 U/L (ref 38–126)
Anion gap: 15 (ref 5–15)
BUN: 9 mg/dL (ref 6–20)
CO2: 25 mmol/L (ref 22–32)
Calcium: 7.3 mg/dL — ABNORMAL LOW (ref 8.9–10.3)
Chloride: 101 mmol/L (ref 98–111)
Creatinine, Ser: 0.98 mg/dL (ref 0.61–1.24)
GFR calc Af Amer: >60 mL/min (ref >60)
GFR calc non Af Amer: >60 mL/min (ref >60)
Glucose, Bld: 178 mg/dL — ABNORMAL HIGH (ref 70–99)
Potassium: 4.3 mmol/L (ref 3.5–5.1)
Sodium: 141 mmol/L (ref 135–145)
Total Bilirubin: 0.7 mg/dL (ref 0.3–1.2)
Total Protein: 5.5 g/dL — ABNORMAL LOW (ref 6.5–8.1)

## 2019-12-11 LAB — CBC
HCT: 31.6 % — ABNORMAL LOW (ref 39.0–52.0)
Hemoglobin: 10.3 g/dL — ABNORMAL LOW (ref 13.0–17.0)
MCH: 28.9 pg (ref 26.0–34.0)
MCHC: 32.6 g/dL (ref 30.0–36.0)
MCV: 88.5 fL (ref 80.0–100.0)
Platelets: 161 10*3/uL (ref 150–400)
RBC: 3.57 MIL/uL — ABNORMAL LOW (ref 4.22–5.81)
RDW: 13.1 % (ref 11.5–15.5)
WBC: 11.1 10*3/uL — ABNORMAL HIGH (ref 4.0–10.5)
nRBC: 0 % (ref 0.0–0.2)

## 2019-12-11 LAB — LACTIC ACID, PLASMA: Lactic Acid, Venous: 3.7 mmol/L (ref 0.5–1.9)

## 2019-12-11 LAB — VITAMIN D 25 HYDROXY (VIT D DEFICIENCY, FRACTURES): Vit D, 25-Hydroxy: 22.78 ng/mL — ABNORMAL LOW (ref 30–100)

## 2019-12-11 LAB — POCT I-STAT, CHEM 8
BUN: 9 mg/dL (ref 6–20)
Calcium, Ion: 1.06 mmol/L — ABNORMAL LOW (ref 1.15–1.40)
Chloride: 105 mmol/L (ref 98–111)
Creatinine, Ser: 1 mg/dL (ref 0.61–1.24)
Glucose, Bld: 122 mg/dL — ABNORMAL HIGH (ref 70–99)
HCT: 33 % — ABNORMAL LOW (ref 39.0–52.0)
Hemoglobin: 11.2 g/dL — ABNORMAL LOW (ref 13.0–17.0)
Potassium: 3.9 mmol/L (ref 3.5–5.1)
Sodium: 143 mmol/L (ref 135–145)
TCO2: 27 mmol/L (ref 22–32)

## 2019-12-11 MED ORDER — CHLORHEXIDINE GLUCONATE CLOTH 2 % EX PADS
6.0000 | MEDICATED_PAD | Freq: Every day | CUTANEOUS | Status: DC
  Administered 2019-12-12 – 2019-12-16 (×4): 6 via TOPICAL

## 2019-12-11 MED ORDER — LEVOFLOXACIN IN D5W 500 MG/100ML IV SOLN
500.0000 mg | INTRAVENOUS | Status: DC
  Administered 2019-12-11 – 2019-12-12 (×2): 500 mg via INTRAVENOUS
  Filled 2019-12-11 (×4): qty 100

## 2019-12-11 MED ORDER — SUGAMMADEX SODIUM 200 MG/2ML IV SOLN
INTRAVENOUS | Status: DC | PRN
  Administered 2019-12-11: 200 mg via INTRAVENOUS

## 2019-12-11 MED ORDER — VITAMIN D 25 MCG (1000 UNIT) PO TABS
2000.0000 [IU] | ORAL_TABLET | Freq: Two times a day (BID) | ORAL | Status: DC
  Administered 2019-12-11 – 2019-12-18 (×15): 2000 [IU] via ORAL
  Filled 2019-12-11 (×15): qty 2

## 2019-12-11 MED ORDER — GABAPENTIN 100 MG PO CAPS
100.0000 mg | ORAL_CAPSULE | Freq: Three times a day (TID) | ORAL | Status: DC
  Administered 2019-12-11 – 2019-12-18 (×22): 100 mg via ORAL
  Filled 2019-12-11 (×22): qty 1

## 2019-12-11 MED ORDER — CLINDAMYCIN PHOSPHATE 900 MG/50ML IV SOLN
900.0000 mg | Freq: Three times a day (TID) | INTRAVENOUS | Status: DC
  Administered 2019-12-11 – 2019-12-12 (×5): 900 mg via INTRAVENOUS
  Filled 2019-12-11 (×9): qty 50

## 2019-12-11 MED ORDER — ASCORBIC ACID 500 MG PO TABS
1000.0000 mg | ORAL_TABLET | Freq: Every day | ORAL | Status: DC
  Administered 2019-12-11 – 2019-12-18 (×8): 1000 mg via ORAL
  Filled 2019-12-11 (×8): qty 2

## 2019-12-11 NOTE — Anesthesia Postprocedure Evaluation (Signed)
Anesthesia Post Note  Patient: Derrick Baker  Procedure(s) Performed: OPEN REDUCTION INTERNAL FIXATION (ORIF) ULNAR FRACTURE (Right ) CLOSED REDUCTION RADIAL HEAD DISLOCATION (Right ) IRRIGATION AND DEBRIDEMENT OF RIGHT PATELLA AND APPLICATION OF WOUND VAC (Right )     Patient location during evaluation: PACU Anesthesia Type: General Level of consciousness: awake and alert Pain management: pain level controlled Vital Signs Assessment: post-procedure vital signs reviewed and stable Respiratory status: spontaneous breathing, nonlabored ventilation, respiratory function stable and patient connected to nasal cannula oxygen Cardiovascular status: blood pressure returned to baseline and stable Postop Assessment: no apparent nausea or vomiting Anesthetic complications: no    Last Vitals:  Vitals:   12/11/19 0600 12/11/19 0700  BP: 133/84   Pulse:  (!) 109  Resp:  15  Temp:    SpO2:  92%    Last Pain:  Vitals:   12/11/19 0400  TempSrc: Oral  PainSc: 5                  Kennieth Rad
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
## 2019-12-11 NOTE — Transfer of Care (Signed)
Immediate Anesthesia Transfer of Care Note  Patient: Derrick Baker  Procedure(s) Performed: OPEN REDUCTION INTERNAL FIXATION (ORIF) ULNAR FRACTURE (Right ) CLOSED REDUCTION RADIAL HEAD DISLOCATION (Right ) IRRIGATION AND DEBRIDEMENT OF RIGHT PATELLA AND APPLICATION OF WOUND VAC (Right )  Patient Location: ICU  Anesthesia Type:General  Level of Consciousness: awake and responds to stimulation  Airway & Oxygen Therapy: Patient Spontanous Breathing and Patient connected to face mask oxygen  Post-op Assessment: Report given to RN and Post -op Vital signs reviewed and stable  Post vital signs: Reviewed and stable  Last Vitals:  Vitals Value Taken Time  BP 139/80 12/11/19 0045  Temp    Pulse 122 12/11/19 0048  Resp 23 12/11/19 0048  SpO2 98 % 12/11/19 0048  Vitals shown include unvalidated device data.  Last Pain:  Vitals:   12/11/19 0030  TempSrc:   PainSc: (P) 5          Complications: No apparent anesthesia complications
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
## 2019-12-11 NOTE — Progress Notes (Signed)
Orthopedic Tech Progress Note Patient Details:  Derrick Baker November 29, 1985 [id number]  Ortho Devices Type of Ortho Device: Postop shoe/boot Ortho Device/Splint Location: RLE Ortho Device/Splint Interventions: Ordered, Application   Post Interventions Patient Tolerated: Well Instructions Provided: Care of device, Adjustment of device   Donald Pore 12/11/2019, 1:15 PM
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
## 2019-12-11 NOTE — Plan of Care (Signed)
  Problem: Education: Goal: Knowledge of risk factors and measures for prevention of condition will improve Outcome: Progressing   Problem: Coping: Goal: Psychosocial and spiritual needs will be supported Outcome: Progressing   Problem: Respiratory: Goal: Will maintain a patent airway Outcome: Progressing Goal: Complications related to the disease process, condition or treatment will be avoided or minimized Outcome: Progressing   
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
## 2019-12-11 NOTE — Brief Op Note (Signed)
12/10/2019 - 12/11/2019  12:23 AM  PATIENT:  Derrick Baker  34 y.o. male  PRE-OPERATIVE DIAGNOSIS:   1. OPEN RIGHT PATELLA FX, GRADE 3C 2. CLOSED MONTEGGIA FRACTURE DISLOCATION (COMMINUTED OLECRANON WITH EXTENSION INTO THE CORONOID AND DISLOCATION OF THE RADIAL HEAD) 3. TRAUMATIC ARTHROTOMY OF THE RIGHT KNEE  4. SUSPECTED QUADRICEPT TENDON INJURY RIGHT KNEE 5. RIGHT FOOT FRACTURES  POST-OPERATIVE DIAGNOSIS:   1. OPEN RIGHT PATELLA FX, GRADE 3C 2. CLOSED MONTEGGIA FRACTURE DISLOCATION (COMMINUTED OLECRANON WITH EXTENSION INTO THE CORONOID AND DISLOCATION OF THE RADIAL HEAD) 3. TRAUMATIC ARTHROTOMY OF THE RIGHT KNEE  4. INCOMPLETE TEAR OF THE QUADRICEPT TENDON RIGHT KNEE 5. RIGHT FOOT FRACTURES  PROCEDURE:  Procedure(s): 1. OPEN REDUCTION INTERNAL FIXATION (ORIF) ULNAR FRACTURE (Right) INCLUDING CORONOID 2. CLOSED REDUCTION RADIAL HEAD DISLOCATION (Right) 3. IRRIGATION AND DEBRIDEMENT OF TRAUMATIC ARTHROTOMY RIGHT KNEE WITH EXPLORATION OF PARTIAL QUADRICEPT TENDON TEAR 4.  IRRIGATION AND DEBRIDEMENT OF OPEN RIGHT PATELLA INCLUDING BONE 5. APPLICATION OF LARGE WOUND VAC (Right)  SURGEON:  Surgeon(s) and Role:    Myrene Galas, MD - Primary  PHYSICIAN ASSISTANT: Montez Morita, PA-C  ANESTHESIA:   general  EBL:  100 mL   BLOOD ADMINISTERED:none  DRAINS:  1. Hemovac in the knee joint (Traumatic arthrotomy) 2. Wound vac over the open patella (white and black foam)  LOCAL MEDICATIONS USED:  NONE  SPECIMEN:  No Specimen  DISPOSITION OF SPECIMEN:  N/A  COUNTS:  YES  TOURNIQUET:  * Missing tourniquet times found for documented tourniquets in log: [id number] *  DICTATION: .[id number]  PLAN OF CARE: Admit to inpatient   PATIENT DISPOSITION:  PACU - hemodynamically stable.   Delay start of Pharmacological VTE agent (>24hrs) due to surgical blood loss or risk of bleeding: no

## 2019-12-12 ENCOUNTER — Inpatient Hospital Stay (HOSPITAL_COMMUNITY): Payer: BC Managed Care – PPO

## 2019-12-12 ENCOUNTER — Inpatient Hospital Stay (HOSPITAL_COMMUNITY): Payer: BC Managed Care – PPO | Admitting: Anesthesiology

## 2019-12-12 ENCOUNTER — Encounter (HOSPITAL_COMMUNITY): Admission: EM | Disposition: A | Discharge status: Home Health | Payer: Self-pay | Source: Home / Self Care

## 2019-12-12 DIAGNOSIS — S8991XA Unspecified injury of right lower leg, initial encounter: Secondary | ICD-10-CM

## 2019-12-12 HISTORY — PX: APPLICATION OF WOUND VAC: SHX5189

## 2019-12-12 HISTORY — PX: I & D EXTREMITY: SHX5045

## 2019-12-12 LAB — URINALYSIS, ROUTINE W REFLEX MICROSCOPIC
Bacteria, UA: NONE SEEN
Bilirubin Urine: NEGATIVE
Glucose, UA: NEGATIVE mg/dL
Ketones, ur: NEGATIVE mg/dL
Leukocytes,Ua: NEGATIVE
Nitrite: NEGATIVE
Protein, ur: 30 mg/dL — AB
RBC / HPF: >50 RBC/hpf — ABNORMAL HIGH (ref 0–5)
Specific Gravity, Urine: 1.026 (ref 1.005–1.030)
pH: 6 (ref 5.0–8.0)

## 2019-12-12 LAB — CBC
HCT: 27 % — ABNORMAL LOW (ref 39.0–52.0)
Hemoglobin: 9 g/dL — ABNORMAL LOW (ref 13.0–17.0)
MCH: 29.7 pg (ref 26.0–34.0)
MCHC: 33.3 g/dL (ref 30.0–36.0)
MCV: 89.1 fL (ref 80.0–100.0)
Platelets: 139 10*3/uL — ABNORMAL LOW (ref 150–400)
RBC: 3.03 MIL/uL — ABNORMAL LOW (ref 4.22–5.81)
RDW: 13.1 % (ref 11.5–15.5)
WBC: 9.7 10*3/uL (ref 4.0–10.5)
nRBC: 0 % (ref 0.0–0.2)

## 2019-12-12 LAB — SURGICAL PCR SCREEN
MRSA, PCR: NEGATIVE
Staphylococcus aureus: POSITIVE — AB

## 2019-12-12 LAB — LACTIC ACID, PLASMA
Lactic Acid, Venous: 1.5 mmol/L (ref 0.5–1.9)
Lactic Acid, Venous: 1.7 mmol/L (ref 0.5–1.9)

## 2019-12-12 LAB — BASIC METABOLIC PANEL
Anion gap: 7 (ref 5–15)
BUN: 12 mg/dL (ref 6–20)
CO2: 29 mmol/L (ref 22–32)
Calcium: 7.9 mg/dL — ABNORMAL LOW (ref 8.9–10.3)
Chloride: 101 mmol/L (ref 98–111)
Creatinine, Ser: 0.79 mg/dL (ref 0.61–1.24)
GFR calc Af Amer: >60 mL/min (ref >60)
GFR calc non Af Amer: >60 mL/min (ref >60)
Glucose, Bld: 131 mg/dL — ABNORMAL HIGH (ref 70–99)
Potassium: 4 mmol/L (ref 3.5–5.1)
Sodium: 137 mmol/L (ref 135–145)

## 2019-12-12 SURGERY — IRRIGATION AND DEBRIDEMENT EXTREMITY
Anesthesia: General | Site: Knee | Laterality: Right

## 2019-12-12 MED ORDER — MIDAZOLAM HCL 2 MG/2ML IJ SOLN
INTRAMUSCULAR | Status: AC
  Filled 2019-12-12: qty 2

## 2019-12-12 MED ORDER — VANCOMYCIN HCL 1000 MG IV SOLR
INTRAVENOUS | Status: DC | PRN
  Administered 2019-12-12: 1000 mg

## 2019-12-12 MED ORDER — MUPIROCIN 2 % EX OINT
1.0000 "application " | TOPICAL_OINTMENT | Freq: Two times a day (BID) | CUTANEOUS | Status: AC
  Administered 2019-12-12 – 2019-12-16 (×9): 1 via NASAL
  Filled 2019-12-12 (×5): qty 22

## 2019-12-12 MED ORDER — PROMETHAZINE HCL 25 MG/ML IJ SOLN: 6.2500 mg | INTRAMUSCULAR | Status: DC | PRN

## 2019-12-12 MED ORDER — PROPOFOL 10 MG/ML IV BOLUS
INTRAVENOUS | Status: DC | PRN
  Administered 2019-12-12: 160 mg via INTRAVENOUS

## 2019-12-12 MED ORDER — FENTANYL CITRATE (PF) 250 MCG/5ML IJ SOLN
INTRAMUSCULAR | Status: AC
  Filled 2019-12-12: qty 5

## 2019-12-12 MED ORDER — FENTANYL CITRATE (PF) 100 MCG/2ML IJ SOLN
INTRAMUSCULAR | Status: AC
  Administered 2019-12-12: 50 ug via INTRAVENOUS
  Filled 2019-12-12: qty 2

## 2019-12-12 MED ORDER — ALBUMIN HUMAN 5 % IV SOLN: INTRAVENOUS | Status: DC | PRN

## 2019-12-12 MED ORDER — LEVOFLOXACIN IN D5W 500 MG/100ML IV SOLN
500.0000 mg | INTRAVENOUS | Status: AC
  Administered 2019-12-13 – 2019-12-15 (×3): 500 mg via INTRAVENOUS
  Filled 2019-12-12 (×5): qty 100

## 2019-12-12 MED ORDER — LIDOCAINE 2% (20 MG/ML) 5 ML SYRINGE
INTRAMUSCULAR | Status: DC | PRN
  Administered 2019-12-12: 80 mg via INTRAVENOUS

## 2019-12-12 MED ORDER — DEXAMETHASONE SODIUM PHOSPHATE 10 MG/ML IJ SOLN
INTRAMUSCULAR | Status: DC | PRN
  Administered 2019-12-12: 10 mg via INTRAVENOUS

## 2019-12-12 MED ORDER — SUGAMMADEX SODIUM 200 MG/2ML IV SOLN
INTRAVENOUS | Status: DC | PRN
  Administered 2019-12-12: 200 mg via INTRAVENOUS

## 2019-12-12 MED ORDER — PROPOFOL 10 MG/ML IV BOLUS
INTRAVENOUS | Status: AC
  Filled 2019-12-12: qty 20

## 2019-12-12 MED ORDER — METRONIDAZOLE IN NACL 5-0.79 MG/ML-% IV SOLN
500.0000 mg | Freq: Three times a day (TID) | INTRAVENOUS | Status: AC
  Administered 2019-12-13 – 2019-12-15 (×8): 500 mg via INTRAVENOUS
  Filled 2019-12-12 (×8): qty 100

## 2019-12-12 MED ORDER — SUCCINYLCHOLINE CHLORIDE 200 MG/10ML IV SOSY
PREFILLED_SYRINGE | INTRAVENOUS | Status: DC | PRN
  Administered 2019-12-12: 100 mg via INTRAVENOUS

## 2019-12-12 MED ORDER — STERILE WATER FOR IRRIGATION IR SOLN
Status: DC | PRN
  Administered 2019-12-12: 1000 mL

## 2019-12-12 MED ORDER — ONDANSETRON HCL 4 MG/2ML IJ SOLN
INTRAMUSCULAR | Status: DC | PRN
  Administered 2019-12-12: 4 mg via INTRAVENOUS

## 2019-12-12 MED ORDER — ROCURONIUM BROMIDE 10 MG/ML (PF) SYRINGE
PREFILLED_SYRINGE | INTRAVENOUS | Status: AC
  Filled 2019-12-12: qty 10

## 2019-12-12 MED ORDER — BETHANECHOL CHLORIDE 10 MG PO TABS
10.0000 mg | ORAL_TABLET | Freq: Three times a day (TID) | ORAL | Status: DC
  Administered 2019-12-12 – 2019-12-18 (×19): 10 mg via ORAL
  Filled 2019-12-12 (×21): qty 1

## 2019-12-12 MED ORDER — TOBRAMYCIN SULFATE 1.2 G IJ SOLR
INTRAMUSCULAR | Status: DC | PRN
  Administered 2019-12-12: 1.2 g

## 2019-12-12 MED ORDER — SODIUM CHLORIDE 0.9 % IR SOLN
Status: DC | PRN
  Administered 2019-12-12: 3000 mL

## 2019-12-12 MED ORDER — 0.9 % SODIUM CHLORIDE (POUR BTL) OPTIME
TOPICAL | Status: DC | PRN
  Administered 2019-12-12: 1000 mL

## 2019-12-12 MED ORDER — FENTANYL CITRATE (PF) 100 MCG/2ML IJ SOLN
INTRAMUSCULAR | Status: DC | PRN
  Administered 2019-12-12 (×2): 100 ug via INTRAVENOUS
  Administered 2019-12-12: 50 ug via INTRAVENOUS

## 2019-12-12 MED ORDER — MIDAZOLAM HCL 5 MG/5ML IJ SOLN
INTRAMUSCULAR | Status: DC | PRN
  Administered 2019-12-12: 2 mg via INTRAVENOUS

## 2019-12-12 MED ORDER — VANCOMYCIN HCL 1000 MG IV SOLR
INTRAVENOUS | Status: AC
  Filled 2019-12-12: qty 1000

## 2019-12-12 MED ORDER — LIDOCAINE 2% (20 MG/ML) 5 ML SYRINGE
INTRAMUSCULAR | Status: AC
  Filled 2019-12-12: qty 5

## 2019-12-12 MED ORDER — TAMSULOSIN HCL 0.4 MG PO CAPS
0.4000 mg | ORAL_CAPSULE | Freq: Every day | ORAL | Status: DC
  Administered 2019-12-12 – 2019-12-18 (×7): 0.4 mg via ORAL
  Filled 2019-12-12 (×7): qty 1

## 2019-12-12 MED ORDER — TOBRAMYCIN SULFATE 1.2 G IJ SOLR
INTRAMUSCULAR | Status: AC
  Filled 2019-12-12: qty 1.2

## 2019-12-12 MED ORDER — FENTANYL CITRATE (PF) 100 MCG/2ML IJ SOLN
25.0000 ug | INTRAMUSCULAR | Status: DC | PRN
  Administered 2019-12-12: 21:00:00 50 ug via INTRAVENOUS

## 2019-12-12 MED ORDER — ROCURONIUM BROMIDE 10 MG/ML (PF) SYRINGE
PREFILLED_SYRINGE | INTRAVENOUS | Status: DC | PRN
  Administered 2019-12-12: 50 mg via INTRAVENOUS

## 2019-12-12 SURGICAL SUPPLY — 57 items
BNDG COHESIVE 4X5 TAN STRL (GAUZE/BANDAGES/DRESSINGS) ×3 IMPLANT
BNDG ELASTIC 4X5.8 VLCR STR LF (GAUZE/BANDAGES/DRESSINGS) ×4 IMPLANT
BNDG ELASTIC 6X5.8 VLCR STR LF (GAUZE/BANDAGES/DRESSINGS) ×2 IMPLANT
BNDG GAUZE ELAST 4 BULKY (GAUZE/BANDAGES/DRESSINGS) ×6 IMPLANT
BRUSH SCRUB EZ PLAIN DRY (MISCELLANEOUS) ×6 IMPLANT
CANISTER WOUND CARE 500ML ATS (WOUND CARE) ×2 IMPLANT
CASSETTE VERAFLO VERALINK (MISCELLANEOUS) ×2 IMPLANT
COVER SURGICAL LIGHT HANDLE (MISCELLANEOUS) ×6 IMPLANT
COVER WAND RF STERILE (DRAPES) IMPLANT
DRAPE U-SHAPE 47X51 STRL (DRAPES) ×3 IMPLANT
DRESSING VERAFLO CLEANSE CC (GAUZE/BANDAGES/DRESSINGS) IMPLANT
DRSG ADAPTIC 3X8 NADH LF (GAUZE/BANDAGES/DRESSINGS) ×3 IMPLANT
DRSG MEPILEX BORDER 4X4 (GAUZE/BANDAGES/DRESSINGS) ×2 IMPLANT
DRSG PAD ABDOMINAL 8X10 ST (GAUZE/BANDAGES/DRESSINGS) ×2 IMPLANT
DRSG VAC ATS MED SENSATRAC (GAUZE/BANDAGES/DRESSINGS) ×2 IMPLANT
DRSG VERAFLO CLEANSE CC (GAUZE/BANDAGES/DRESSINGS) ×3
ELECT REM PT RETURN 9FT ADLT (ELECTROSURGICAL)
ELECTRODE REM PT RTRN 9FT ADLT (ELECTROSURGICAL) IMPLANT
GAUZE SPONGE 4X4 12PLY STRL (GAUZE/BANDAGES/DRESSINGS) ×3 IMPLANT
GLOVE BIO SURGEON STRL SZ7.5 (GLOVE) ×3 IMPLANT
GLOVE BIO SURGEON STRL SZ8 (GLOVE) ×3 IMPLANT
GLOVE BIOGEL PI IND STRL 7.5 (GLOVE) ×1 IMPLANT
GLOVE BIOGEL PI IND STRL 8 (GLOVE) ×1 IMPLANT
GLOVE BIOGEL PI IND STRL 9 (GLOVE) ×1 IMPLANT
GLOVE BIOGEL PI INDICATOR 7.5 (GLOVE) ×2
GLOVE BIOGEL PI INDICATOR 8 (GLOVE) ×2
GLOVE BIOGEL PI INDICATOR 9 (GLOVE) ×2
GOWN STRL REUS W/ TWL LRG LVL3 (GOWN DISPOSABLE) ×2 IMPLANT
GOWN STRL REUS W/ TWL XL LVL3 (GOWN DISPOSABLE) ×1 IMPLANT
GOWN STRL REUS W/TWL LRG LVL3 (GOWN DISPOSABLE) ×4
GOWN STRL REUS W/TWL XL LVL3 (GOWN DISPOSABLE) ×2
HANDPIECE INTERPULSE COAX TIP (DISPOSABLE)
KIT BASIN OR (CUSTOM PROCEDURE TRAY) ×3 IMPLANT
KIT STIMULAN RAPID CURE  10CC (Orthopedic Implant) ×2 IMPLANT
KIT STIMULAN RAPID CURE 10CC (Orthopedic Implant) IMPLANT
KIT TURNOVER KIT B (KITS) ×3 IMPLANT
MANIFOLD NEPTUNE II (INSTRUMENTS) ×3 IMPLANT
NS IRRIG 1000ML POUR BTL (IV SOLUTION) ×3 IMPLANT
PACK ORTHO EXTREMITY (CUSTOM PROCEDURE TRAY) ×3 IMPLANT
PAD ARMBOARD 7.5X6 YLW CONV (MISCELLANEOUS) ×6 IMPLANT
PAD CAST 4YDX4 CTTN HI CHSV (CAST SUPPLIES) IMPLANT
PADDING CAST COTTON 4X4 STRL (CAST SUPPLIES) ×2
PADDING CAST COTTON 6X4 STRL (CAST SUPPLIES) ×3 IMPLANT
SET HNDPC FAN SPRY TIP SCT (DISPOSABLE) IMPLANT
SOL PREP POV-IOD 4OZ 10% (MISCELLANEOUS) ×3 IMPLANT
SOL PREP PROV IODINE SCRUB 4OZ (MISCELLANEOUS) ×3 IMPLANT
SPONGE LAP 18X18 RF (DISPOSABLE) ×3 IMPLANT
STOCKINETTE IMPERVIOUS 9X36 MD (GAUZE/BANDAGES/DRESSINGS) IMPLANT
SUT ETHILON 2 0 PSLX (SUTURE) ×6 IMPLANT
SUT PDS AB 2-0 CT1 27 (SUTURE) ×4 IMPLANT
TOWEL GREEN STERILE (TOWEL DISPOSABLE) ×6 IMPLANT
TOWEL GREEN STERILE FF (TOWEL DISPOSABLE) ×3 IMPLANT
TUBE CONNECTING 12'X1/4 (SUCTIONS) ×1
TUBE CONNECTING 12X1/4 (SUCTIONS) ×2 IMPLANT
UNDERPAD 30X30 (UNDERPADS AND DIAPERS) ×3 IMPLANT
WATER STERILE IRR 1000ML POUR (IV SOLUTION) ×3 IMPLANT
YANKAUER SUCT BULB TIP NO VENT (SUCTIONS) ×3 IMPLANT

## 2019-12-12 NOTE — Anesthesia Postprocedure Evaluation (Signed)
Anesthesia Post Note  Patient: Derrick Baker  Procedure(s) Performed: IRRIGATION AND DEBRIDEMENT EXTREMITY (Right Knee) Application Of Wound Vac (Right Knee)     Patient location during evaluation: PACU Anesthesia Type: General Level of consciousness: awake and alert Pain management: pain level controlled Vital Signs Assessment: post-procedure vital signs reviewed and stable Respiratory status: spontaneous breathing, nonlabored ventilation and respiratory function stable Cardiovascular status: blood pressure returned to baseline and stable Postop Assessment: no apparent nausea or vomiting Anesthetic complications: no    Last Vitals:  Vitals:   12/12/19 2043 12/12/19 2058  BP: (!) 153/84 137/88  Pulse: (!) 128 (!) 126  Resp: 20 18  Temp:  37.7 C  SpO2: 94% 96%    Last Pain:  Vitals:   12/12/19 2043  TempSrc:   PainSc: 8                  Cecile Hearing

## 2019-12-12 NOTE — Plan of Care (Signed)
Discussed plan of care with the patient for the evening, admission questions and prep for his surgery with some teach back displayed.  Patient updating family by phone his self.

## 2019-12-12 NOTE — Progress Notes (Signed)
Inpatient Rehabilitation Admissions Coordinator  Inpatient rehab consult received. Noted COVID + 3/21. Patients are eligible when > 20 days since positive to be considered for CIR admit. They do not require retesting for possible admit. Patients less than 20 days from onset still require 2 negative tests prior to possible admit. This patient does not meet this criteria at this time. I will follow his progress. Please call me with any questions.  Ottie Glazier, RN, MSN Rehab Admissions Coordinator [phone] 12/12/2019 8:27 AM
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
## 2019-12-12 NOTE — Progress Notes (Signed)
Bladder scan per order - 468 ml urine retention. In and out - draining 1600 ml urine. PA made aware. Will continue to monitor.

## 2019-12-12 NOTE — Progress Notes (Signed)
Not an Acute Change Lactic Acid 3.7 and 1 L LR Bolus to be given

## 2019-12-12 NOTE — Progress Notes (Signed)
Patient going to OR for I&D. OR nurse was called for report.
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
## 2019-12-12 NOTE — Transfer of Care (Signed)
Immediate Anesthesia Transfer of Care Note  Patient: Derrick Baker  Procedure(s) Performed: IRRIGATION AND DEBRIDEMENT EXTREMITY (Right Knee) Application Of Wound Vac (Right Knee)  Patient Location: OR, covid +  Anesthesia Type:General  Level of Consciousness: awake, alert  and oriented  Airway & Oxygen Therapy: Patient Spontanous Breathing and Patient connected to face mask oxygen  Post-op Assessment: Report given to RN and Post -op Vital signs reviewed and stable  Post vital signs: Reviewed and stable  Last Vitals:  Vitals Value Taken Time  BP    Temp    Pulse    Resp    SpO2      Last Pain:  Vitals:   12/12/19 1809  TempSrc:   PainSc: 4       Patients Stated Pain Goal: 3 (12/12/19 1809)  Complications: No apparent anesthesia complications

## 2019-12-12 NOTE — Anesthesia Procedure Notes (Signed)
Procedure Name: Intubation Date/Time: 12/12/2019 6:57 PM Performed by: Izola Price., CRNA Pre-anesthesia Checklist: Patient identified, Emergency Drugs available, Suction available and Patient being monitored Patient Re-evaluated:Patient Re-evaluated prior to induction Oxygen Delivery Method: Circle system utilized Preoxygenation: Pre-oxygenation with 100% oxygen Induction Type: IV induction and Rapid sequence Laryngoscope Size: 4 and Glidescope Grade View: Grade I Tube type: Oral Tube size: 7.5 mm Number of attempts: 1 Airway Equipment and Method: Video-laryngoscopy and Stylet Placement Confirmation: ETT inserted through vocal cords under direct vision,  positive ETCO2 and breath sounds checked- equal and bilateral Secured at: 23 cm Tube secured with: Tape Dental Injury: Teeth and Oropharynx as per pre-operative assessment  Comments: Head and neck maintained in neutral alignment

## 2019-12-12 NOTE — Progress Notes (Addendum)
Patient alert and oriented x 4 was asked if he wants staff to call his family with updates. He verbalized that he is able to informed his family and staff doesn't need to call.
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
## 2019-12-13 ENCOUNTER — Inpatient Hospital Stay (HOSPITAL_COMMUNITY): Payer: BC Managed Care – PPO

## 2019-12-13 ENCOUNTER — Encounter (HOSPITAL_COMMUNITY): Payer: Self-pay

## 2019-12-13 LAB — CBC
HCT: 21.5 % — ABNORMAL LOW (ref 39.0–52.0)
Hemoglobin: 7.1 g/dL — ABNORMAL LOW (ref 13.0–17.0)
MCH: 29.3 pg (ref 26.0–34.0)
MCHC: 33 g/dL (ref 30.0–36.0)
MCV: 88.8 fL (ref 80.0–100.0)
Platelets: 126 10*3/uL — ABNORMAL LOW (ref 150–400)
RBC: 2.42 MIL/uL — ABNORMAL LOW (ref 4.22–5.81)
RDW: 12.5 % (ref 11.5–15.5)
WBC: 7.1 10*3/uL (ref 4.0–10.5)
nRBC: 0 % (ref 0.0–0.2)

## 2019-12-13 LAB — BASIC METABOLIC PANEL
Anion gap: 10 (ref 5–15)
BUN: 7 mg/dL (ref 6–20)
CO2: 29 mmol/L (ref 22–32)
Calcium: 8.6 mg/dL — ABNORMAL LOW (ref 8.9–10.3)
Chloride: 101 mmol/L (ref 98–111)
Creatinine, Ser: 0.71 mg/dL (ref 0.61–1.24)
GFR calc Af Amer: >60 mL/min (ref >60)
GFR calc non Af Amer: >60 mL/min (ref >60)
Glucose, Bld: 140 mg/dL — ABNORMAL HIGH (ref 70–99)
Potassium: 4.3 mmol/L (ref 3.5–5.1)
Sodium: 140 mmol/L (ref 135–145)

## 2019-12-13 LAB — ABO/RH: ABO/RH(D): O POS

## 2019-12-13 LAB — PREPARE RBC (CROSSMATCH)

## 2019-12-13 MED ORDER — SODIUM CHLORIDE 0.9% IV SOLUTION: Freq: Once | INTRAVENOUS | Status: AC
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
## 2019-12-13 NOTE — TOC Progression Note (Signed)
Transition of Care [facility]) - Progression Note    Patient Details  Name: Derrick Baker MRN: [id number] Date of Birth: [birth year]  Transition of Care [facility]) CM/SW Contact  Jimmy Picket, [place] Phone Number: 12/13/2019, 1:23 PM  Clinical Narrative:     CSW spoke with patient via phone. CSW introduced self and explained role. CSW completed Sbirt with patient. Pt scored a 4 on the sbirt scale, and reported weekly alcohol use. Pt stated he may have 3-5 drinks when he is with his friends. Pt stated that he did not have a problem with alcohol use. CSW offered resources. The pt denied needed resources.
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
## 2019-12-14 ENCOUNTER — Encounter (HOSPITAL_COMMUNITY): Payer: Self-pay

## 2019-12-14 ENCOUNTER — Encounter (HOSPITAL_COMMUNITY): Admission: EM | Disposition: A | Discharge status: Home Health | Payer: Self-pay | Source: Home / Self Care

## 2019-12-14 ENCOUNTER — Inpatient Hospital Stay (HOSPITAL_COMMUNITY): Payer: BC Managed Care – PPO | Admitting: Anesthesiology

## 2019-12-14 DIAGNOSIS — Z8616 Personal history of COVID-19: Secondary | ICD-10-CM

## 2019-12-14 DIAGNOSIS — S8991XA Unspecified injury of right lower leg, initial encounter: Secondary | ICD-10-CM

## 2019-12-14 HISTORY — DX: Personal history of COVID-19: Z86.16

## 2019-12-14 HISTORY — PX: I & D EXTREMITY: SHX5045

## 2019-12-14 HISTORY — PX: APPLICATION OF WOUND VAC: SHX5189

## 2019-12-14 HISTORY — PX: APPLICATION OF A-CELL OF EXTREMITY: SHX6303

## 2019-12-14 LAB — CBC
HCT: 23.3 % — ABNORMAL LOW (ref 39.0–52.0)
Hemoglobin: 7.6 g/dL — ABNORMAL LOW (ref 13.0–17.0)
MCH: 29.6 pg (ref 26.0–34.0)
MCHC: 32.6 g/dL (ref 30.0–36.0)
MCV: 90.7 fL (ref 80.0–100.0)
Platelets: 158 10*3/uL (ref 150–400)
RBC: 2.57 MIL/uL — ABNORMAL LOW (ref 4.22–5.81)
RDW: 12.7 % (ref 11.5–15.5)
WBC: 5.9 10*3/uL (ref 4.0–10.5)
nRBC: 0 % (ref 0.0–0.2)

## 2019-12-14 LAB — BPAM RBC
Blood Product Expiration Date: 202104262359
ISSUE DATE / TIME: 202103241110
Unit Type and Rh: 5100

## 2019-12-14 LAB — BASIC METABOLIC PANEL
Anion gap: 7 (ref 5–15)
BUN: 12 mg/dL (ref 6–20)
CO2: 29 mmol/L (ref 22–32)
Calcium: 8.1 mg/dL — ABNORMAL LOW (ref 8.9–10.3)
Chloride: 103 mmol/L (ref 98–111)
Creatinine, Ser: 0.53 mg/dL — ABNORMAL LOW (ref 0.61–1.24)
GFR calc Af Amer: >60 mL/min (ref >60)
GFR calc non Af Amer: >60 mL/min (ref >60)
Glucose, Bld: 102 mg/dL — ABNORMAL HIGH (ref 70–99)
Potassium: 3.7 mmol/L (ref 3.5–5.1)
Sodium: 139 mmol/L (ref 135–145)

## 2019-12-14 LAB — SARS CORONAVIRUS 2 (TAT 6-24 HRS): SARS Coronavirus 2: POSITIVE — AB

## 2019-12-14 LAB — TYPE AND SCREEN
ABO/RH(D): O POS
Antibody Screen: NEGATIVE
Unit division: 0

## 2019-12-14 SURGERY — IRRIGATION AND DEBRIDEMENT EXTREMITY
Anesthesia: General | Site: Leg Lower | Laterality: Right

## 2019-12-14 MED ORDER — PHENYLEPHRINE 40 MCG/ML (10ML) SYRINGE FOR IV PUSH (FOR BLOOD PRESSURE SUPPORT)
PREFILLED_SYRINGE | INTRAVENOUS | Status: DC | PRN
  Administered 2019-12-14 (×2): 80 ug via INTRAVENOUS

## 2019-12-14 MED ORDER — PROPOFOL 10 MG/ML IV BOLUS
INTRAVENOUS | Status: AC
  Filled 2019-12-14: qty 20

## 2019-12-14 MED ORDER — SODIUM CHLORIDE 0.9 % IR SOLN
Status: DC | PRN
  Administered 2019-12-14: 1000 mL
  Administered 2019-12-14: 1

## 2019-12-14 MED ORDER — DEXAMETHASONE SODIUM PHOSPHATE 10 MG/ML IJ SOLN
INTRAMUSCULAR | Status: DC | PRN
  Administered 2019-12-14: 10 mg via INTRAVENOUS

## 2019-12-14 MED ORDER — MIDAZOLAM HCL 5 MG/5ML IJ SOLN
INTRAMUSCULAR | Status: DC | PRN
  Administered 2019-12-14: 2 mg via INTRAVENOUS

## 2019-12-14 MED ORDER — LIDOCAINE 2% (20 MG/ML) 5 ML SYRINGE
INTRAMUSCULAR | Status: AC
  Filled 2019-12-14: qty 5

## 2019-12-14 MED ORDER — SUCCINYLCHOLINE CHLORIDE 20 MG/ML IJ SOLN
INTRAMUSCULAR | Status: DC | PRN
  Administered 2019-12-14: 100 mg via INTRAVENOUS

## 2019-12-14 MED ORDER — SUCCINYLCHOLINE CHLORIDE 200 MG/10ML IV SOSY
PREFILLED_SYRINGE | INTRAVENOUS | Status: AC
  Filled 2019-12-14: qty 10

## 2019-12-14 MED ORDER — FENTANYL CITRATE (PF) 100 MCG/2ML IJ SOLN
INTRAMUSCULAR | Status: DC | PRN
  Administered 2019-12-14: 50 ug via INTRAVENOUS
  Administered 2019-12-14: 100 ug via INTRAVENOUS

## 2019-12-14 MED ORDER — FENTANYL CITRATE (PF) 100 MCG/2ML IJ SOLN
25.0000 ug | INTRAMUSCULAR | Status: DC | PRN
  Administered 2019-12-14 (×2): 50 ug via INTRAVENOUS

## 2019-12-14 MED ORDER — FENTANYL CITRATE (PF) 250 MCG/5ML IJ SOLN
INTRAMUSCULAR | Status: AC
  Filled 2019-12-14: qty 5

## 2019-12-14 MED ORDER — LIDOCAINE 2% (20 MG/ML) 5 ML SYRINGE
INTRAMUSCULAR | Status: DC | PRN
  Administered 2019-12-14: 100 mg via INTRAVENOUS

## 2019-12-14 MED ORDER — ONDANSETRON HCL 4 MG/2ML IJ SOLN
INTRAMUSCULAR | Status: DC | PRN
  Administered 2019-12-14: 4 mg via INTRAVENOUS

## 2019-12-14 MED ORDER — CHLORHEXIDINE GLUCONATE CLOTH 2 % EX PADS
6.0000 | MEDICATED_PAD | Freq: Once | CUTANEOUS | Status: AC
  Administered 2019-12-14: 07:00:00 6 via TOPICAL

## 2019-12-14 MED ORDER — CIPROFLOXACIN IN D5W 400 MG/200ML IV SOLN
400.0000 mg | INTRAVENOUS | Status: AC
  Administered 2019-12-14: 400 mg via INTRAVENOUS
  Filled 2019-12-14: qty 200

## 2019-12-14 MED ORDER — PROPOFOL 10 MG/ML IV BOLUS
INTRAVENOUS | Status: DC | PRN
  Administered 2019-12-14: 180 mg via INTRAVENOUS

## 2019-12-14 MED ORDER — FENTANYL CITRATE (PF) 100 MCG/2ML IJ SOLN
INTRAMUSCULAR | Status: AC
  Filled 2019-12-14: qty 2

## 2019-12-14 MED ORDER — PROMETHAZINE HCL 25 MG/ML IJ SOLN: 6.2500 mg | INTRAMUSCULAR | Status: DC | PRN

## 2019-12-14 MED ORDER — MIDAZOLAM HCL 2 MG/2ML IJ SOLN
INTRAMUSCULAR | Status: AC
  Filled 2019-12-14: qty 2

## 2019-12-14 SURGICAL SUPPLY — 57 items
BLADE CLIPPER SURG (BLADE) IMPLANT
BNDG ELASTIC 4X5.8 VLCR STR LF (GAUZE/BANDAGES/DRESSINGS) ×4 IMPLANT
BNDG GAUZE ELAST 4 BULKY (GAUZE/BANDAGES/DRESSINGS) ×4 IMPLANT
CANISTER SUCT 3000ML PPV (MISCELLANEOUS) ×3 IMPLANT
CANISTER WOUND CARE 500ML ATS (WOUND CARE) ×3 IMPLANT
CANISTER WOUNDNEG PRESSURE 500 (CANNISTER) ×2 IMPLANT
CHLORAPREP W/TINT 26 (MISCELLANEOUS) IMPLANT
COVER SURGICAL LIGHT HANDLE (MISCELLANEOUS) ×1 IMPLANT
COVER WAND RF STERILE (DRAPES) ×1 IMPLANT
DRAPE HALF SHEET 40X57 (DRAPES) ×2 IMPLANT
DRAPE INCISE IOBAN 66X45 STRL (DRAPES) ×2 IMPLANT
DRAPE ORTHO SPLIT 77X108 STRL (DRAPES) ×2
DRAPE SURG ORHT 6 SPLT 77X108 (DRAPES) IMPLANT
DRESSING HYDROCOLLOID 4X4 (GAUZE/BANDAGES/DRESSINGS) ×3 IMPLANT
DRSG ADAPTIC 3X8 NADH LF (GAUZE/BANDAGES/DRESSINGS) IMPLANT
DRSG CUTIMED SORBACT 7X9 (GAUZE/BANDAGES/DRESSINGS) ×3 IMPLANT
DRSG PAD ABDOMINAL 8X10 ST (GAUZE/BANDAGES/DRESSINGS) IMPLANT
DRSG VAC ATS LRG SENSATRAC (GAUZE/BANDAGES/DRESSINGS) IMPLANT
DRSG VAC ATS MED SENSATRAC (GAUZE/BANDAGES/DRESSINGS) ×2 IMPLANT
DRSG VAC ATS SM SENSATRAC (GAUZE/BANDAGES/DRESSINGS) IMPLANT
ELECT CAUTERY BLADE 6.4 (BLADE) ×3 IMPLANT
ELECT REM PT RETURN 9FT ADLT (ELECTROSURGICAL) ×3
ELECTRODE REM PT RTRN 9FT ADLT (ELECTROSURGICAL) ×1 IMPLANT
GAUZE SPONGE 4X4 12PLY STRL (GAUZE/BANDAGES/DRESSINGS) ×1 IMPLANT
GEL ULTRASOUND 20GR AQUASONIC (MISCELLANEOUS) ×2 IMPLANT
GLOVE BIO SURGEON STRL SZ 6.5 (GLOVE) ×2 IMPLANT
GLOVE BIO SURGEONS STRL SZ 6.5 (GLOVE)
GOWN STRL REUS W/ TWL LRG LVL3 (GOWN DISPOSABLE) ×3 IMPLANT
GOWN STRL REUS W/TWL LRG LVL3 (GOWN DISPOSABLE) ×6
HANDPIECE INTERPULSE COAX TIP (DISPOSABLE) ×2
IV NS IRRIG 3000ML ARTHROMATIC (IV SOLUTION) ×2 IMPLANT
KIT BASIN OR (CUSTOM PROCEDURE TRAY) ×3 IMPLANT
KIT TURNOVER KIT B (KITS) ×3 IMPLANT
MATRIX WOUND 3-LAYER 10X15 (Tissue) ×1 IMPLANT
MICROMATRIX 1000MG (Tissue) ×6 IMPLANT
NS IRRIG 1000ML POUR BTL (IV SOLUTION) ×5 IMPLANT
PACK GENERAL/GYN (CUSTOM PROCEDURE TRAY) ×3 IMPLANT
PAD ARMBOARD 7.5X6 YLW CONV (MISCELLANEOUS) ×8 IMPLANT
PAD NEG PRESSURE SENSATRAC (MISCELLANEOUS) IMPLANT
SET HNDPC FAN SPRY TIP SCT (DISPOSABLE) IMPLANT
SOLUTION PARTIC MCRMTRX 1000MG (Tissue) IMPLANT
STAPLER VISISTAT 35W (STAPLE) IMPLANT
STOCKINETTE IMPERVIOUS 9X36 MD (GAUZE/BANDAGES/DRESSINGS) IMPLANT
STOCKINETTE IMPERVIOUS LG (DRAPES) ×2 IMPLANT
SURGILUBE 2OZ TUBE FLIPTOP (MISCELLANEOUS) ×3 IMPLANT
SUT MNCRL AB 3-0 PS2 18 (SUTURE) ×2 IMPLANT
SUT MNCRL AB 4-0 PS2 18 (SUTURE) ×8 IMPLANT
SUT SILK 4 0 P 3 (SUTURE) IMPLANT
SUT SILK 4 0 PS 2 (SUTURE) IMPLANT
SUT VIC AB 5-0 PS2 18 (SUTURE) ×7 IMPLANT
TOWEL GREEN STERILE (TOWEL DISPOSABLE) ×3 IMPLANT
TOWEL GREEN STERILE FF (TOWEL DISPOSABLE) ×2 IMPLANT
TUBE CONNECTING 12'X1/4 (SUCTIONS) ×1
TUBE CONNECTING 12X1/4 (SUCTIONS) ×2 IMPLANT
UNDERPAD 30X30 (UNDERPADS AND DIAPERS) ×2 IMPLANT
WOUND MATRIX 3-LAYER 10X15 (Tissue) ×1 IMPLANT
YANKAUER SUCT BULB TIP NO VENT (SUCTIONS) ×3 IMPLANT

## 2019-12-14 NOTE — Progress Notes (Signed)
Went to see patient, but he is in OR already.  Will see later today.  [name] 8:47 AM 12/14/2019
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
## 2019-12-14 NOTE — Anesthesia Procedure Notes (Signed)
Procedure Name: Intubation Date/Time: 12/14/2019 8:10 AM Performed by: Quentin Ore, CRNA Pre-anesthesia Checklist: Patient identified, Emergency Drugs available, Suction available, Patient being monitored and Timeout performed Patient Re-evaluated:Patient Re-evaluated prior to induction Oxygen Delivery Method: Circle system utilized Preoxygenation: Pre-oxygenation with 100% oxygen Induction Type: IV induction and Rapid sequence Laryngoscope Size: Glidescope and 4 Tube type: Oral Tube size: 7.5 mm Number of attempts: 1 Airway Equipment and Method: Stylet and Video-laryngoscopy Placement Confirmation: ETT inserted through vocal cords under direct vision,  positive ETCO2 and breath sounds checked- equal and bilateral Secured at: 21 cm Tube secured with: Tape Dental Injury: Teeth and Oropharynx as per pre-operative assessment

## 2019-12-14 NOTE — Interval H&P Note (Signed)
History and Physical Interval Note:  12/14/2019 6:57 AM  Derrick Baker  has presented today for surgery, with the diagnosis of open wound of right knee.  The various methods of treatment have been discussed with the patient and family. After consideration of risks, benefits and other options for treatment, the patient has consented to  Procedure(s) with comments: IRRIGATION AND DEBRIDEMENT RIGHT KNEE (Right) - total 1 hour, please APPLICATION OF A-CELL OF EXTREMITY (Right) APPLICATION OF WOUND VAC (Right) as a surgical intervention.  The patient's history has been reviewed, patient examined, no change in status, stable for surgery.  I have reviewed the patient's chart and labs.  Questions were answered to the patient's satisfaction.     Alena Bills Mickie Badders

## 2019-12-14 NOTE — Transfer of Care (Signed)
Immediate Anesthesia Transfer of Care Note  Patient: JUSTAN GAEDE  Procedure(s) Performed: IRRIGATION AND DEBRIDEMENT RIGHT KNEE (Right Leg Lower) APPLICATION OF A-CELL OF EXTREMITY (Right Leg Lower) APPLICATION OF WOUND VAC (Right Leg Lower)  Patient Location: PACU  Anesthesia Type:General  Level of Consciousness: awake, alert  and oriented  Airway & Oxygen Therapy: Patient Spontanous Breathing and Patient connected to face mask oxygen  Post-op Assessment: Report given to RN, Post -op Vital signs reviewed and stable and Patient moving all extremities X 4  Post vital signs: Reviewed and stable  Last Vitals:  Vitals Value Taken Time  BP 129/75 12/14/19 1201  Temp 36.7 C 12/14/19 1201  Pulse 102 12/14/19 1422  Resp 21 12/14/19 1422  SpO2 94 % 12/14/19 1422  Vitals shown include unvalidated device data.  Last Pain:  Vitals:   12/14/19 1205  TempSrc:   PainSc: 8       Patients Stated Pain Goal: 3 (12/14/19 0445)  Complications: No apparent anesthesia complications

## 2019-12-14 NOTE — Plan of Care (Signed)

## 2019-12-14 NOTE — Op Note (Signed)
DATE OF OPERATION: 12/14/2019  LOCATION: [facility]  PREOPERATIVE DIAGNOSIS: right knee traumatic wound  POSTOPERATIVE DIAGNOSIS: Same  PROCEDURE: Excision of right knee wound 10 x 15 cm skin and soft tissue with Acell placement (2 gm and 10 x 15 cm sheet), placement of VAC dressing  SURGEON: Staysha Truby Sanger Kaileena Obi, DO  ASSISTANT: Joni Fears, PA  EBL: 10 cc  CONDITION: Stable  COMPLICATIONS: None  INDICATION: The patient, Derrick Baker, is a 34 y.o. male born on 1986-04-24, is here for treatment of a right knee wound.  This patient sustained the wound few days ago.  He underwent debridement by orthopedic surgery.  He has been exposed patella.  Antibiotic beads were placed and the patient was adequately washed out.Marland Kitchen   PROCEDURE DETAILS:  The patient was seen prior to surgery and marked.  The IV antibiotics were given. The patient was taken to the operating room and given a general anesthetic. A standard time out was performed and all information was confirmed by those in the room. SCD was placed on the left leg.  The right leg was prepped and draped with Betadine.  The wound was irrigated with saline and Pulsavac.  The antibiotic beads were washed out.  The lateral skin flap was debrided sharply with excision of skin and soft tissue.  The entire 10 x 15 area was excised the edges and the soft tissue area.  Fortunately I did not have to remove or excise any tendon or muscle.  There was reasonable blood flow to the flap.  The very tip was a little bit dusky but did have blood flow.  All of the ACell powder and sheet were applied and tacked in place with the 5-0 Vicryl.  Several 3 and 4-0 Monocryl's were used to try and decrease the gap of the wound.  I had to be very careful with doing this so as not to put too much tension on the flap and risk any blood flow.  A sorbact was applied and tacked into place with the 5-0 Vicryl.  The VAC was placed and secured at 100 mmHg  pressure as to not provide too much pressure on the flap area.  The leg was wrapped with Kerlix and an Ace wrap up to but not including the knee.  The patient was placed back in his knee restricting splint. The patient was allowed to wake up and taken to recovery room in stable condition at the end of the case. A call was not placed to the family at the end of the case per the patient's request.    The advanced practice practitioner (APP) assisted throughout the case.  The APP was essential in retraction and counter traction when needed to make the case progress smoothly.  This retraction and assistance made it possible to see the tissue plans for the procedure.  The assistance was needed for blood control, tissue re-approximation and assisted with closure of the incision site.  The 21st Century Cures Act was signed into law in 2016 which includes the topic of electronic health records.  This provides immediate access to information in MyChart.  This includes consultation notes, operative notes, office notes, lab results and pathology reports.  If you have any questions about what you read please let us know at your next visit or call us at the office.  We are right here with you.

## 2019-12-14 NOTE — Progress Notes (Signed)
Patient ID: Derrick Baker, male   DOB: [birth year], 34 y.o.   MRN: [id number]    Day of Surgery  Subjective: Patient feeling well after surgery today.  Some pressure in his right knee but otherwise doing well.  Just finished eating lunch.  No chest pain since removal of CT yesterday.  No SOB.  Pain is well controlled.  Asking about when he can be discharged and if he can have another COVID test to confirm results as all of his close family members have all had negative tests.  ROS: See above, otherwise other systems negative  Objective: Vital signs in last 24 hours: Temp:  [97.3 F (36.3 C)-98.8 F (37.1 C)] 98.1 F (36.7 C) (03/25 1201) Pulse Rate:  [77-121] 107 (03/25 1201) Resp:  [11-20] 17 (03/25 1201) BP: (108-138)/(57-93) 129/75 (03/25 1201) SpO2:  [91 %-100 %] 100 % (03/25 1201) Last BM Date: 12/12/19  Intake/Output from previous day: 03/24 0701 - 03/25 0700 In: 1706 [P.O.:910; Blood:696; IV Piggyback:100] Out: 4700 [Urine:4300; Drains:400] Intake/Output this shift: Total I/O In: 600 [I.V.:500; IV Piggyback:100] Out: 950 [Urine:950]  PE: Gen: NAD Neck: c-collar in place HEENT: PERRL Heart: regular, mildly tachy Lungs: CTAB, Occlusive dressing in place. Abd: soft, minimally tender, ND, +BS GU: foley in place with clear urine MS: right UE in sling and splint. Moves fingers with normal sensation and good cap refill. RLE in knee immobilizer. Wiggles foot and toes. Normal sensation and good cap refill. Ecchymosis present on toes. Moves LUE and LLE with no issues Neuro: cranial nerves II-XII grossly intact Psych: A&Ox3  Lab Results:  Recent Labs    12/13/19 0404 12/14/19 0436  WBC 7.1 5.9  HGB 7.1* 7.6*  HCT 21.5* 23.3*  PLT 126* 158   BMET Recent Labs    12/13/19 0404 12/14/19 0436  NA 140 139  K 4.3 3.7  CL 101 103  CO2 29 29  GLUCOSE 140* 102*  BUN 7 12  CREATININE 0.71 0.53*  CALCIUM 8.6* 8.1*   PT/INR No results for input(s): LABPROT,  INR in the last 72 hours. CMP     Component Value Date/Time   NA 139 12/14/2019 0436   K 3.7 12/14/2019 0436   CL 103 12/14/2019 0436   CO2 29 12/14/2019 0436   GLUCOSE 102 (H) 12/14/2019 0436   BUN 12 12/14/2019 0436   CREATININE 0.53 (L) 12/14/2019 0436   CALCIUM 8.1 (L) 12/14/2019 0436   PROT 5.5 (L) 12/11/2019 0718   ALBUMIN 3.0 (L) 12/11/2019 0718   AST 257 (H) 12/11/2019 0718   ALT 223 (H) 12/11/2019 0718   ALKPHOS 47 12/11/2019 0718   BILITOT 0.7 12/11/2019 0718   GFRNONAA >60 12/14/2019 0436   GFRAA >60 12/14/2019 0436   Lipase  No results found for: LIPASE     Studies/Results: DG Ankle Complete Left  Result Date: 12/13/2019 CLINICAL DATA:  Swelling EXAM: LEFT ANKLE COMPLETE - 3+ VIEW COMPARISON:  None. FINDINGS: Mild diffuse soft tissue swelling. No acute bony abnormality. Specifically, no fracture, subluxation, or dislocation. Joint spaces maintained. IMPRESSION: No acute bony abnormality. Electronically Signed   By: Charlett Nose M.D.   On: 12/13/2019 00:08   DG CHEST PORT 1 VIEW  Result Date: 12/13/2019 CLINICAL DATA:  Follow-up chest tube removed EXAM: PORTABLE CHEST 1 VIEW COMPARISON:  12/12/2019 FINDINGS: Pigtail catheter on the right has been removed in the interval. No recurrent pneumothorax is seen. The lungs are clear. Mild elevation of the right hemidiaphragm is noted. No  bony abnormality is seen. Multiple right-sided rib fractures are noted. IMPRESSION: No recurrent pneumothorax following chest tube removal. Electronically Signed   By: Alcide Clever M.D.   On: 12/13/2019 15:29   DG CHEST PORT 1 VIEW  Result Date: 12/13/2019 CLINICAL DATA:  Right pneumothorax EXAM: PORTABLE CHEST 1 VIEW COMPARISON:  12/12/2019 FINDINGS: Right chest tube remains in place. No visible pneumothorax. Right rib fractures again noted. Lungs clear. Heart is normal size. IMPRESSION: Right chest tube remains in place.  No visible pneumothorax. Electronically Signed   By: Charlett Nose  M.D.   On: 12/13/2019 00:09    Anti-infectives: Anti-infectives (From admission, onward)   Start     Dose/Rate Route Frequency Ordered Stop   12/14/19 0630  ciprofloxacin (CIPRO) IVPB 400 mg     400 mg 200 mL/hr over 60 Minutes Intravenous On call to O.R. 12/14/19 9163 12/14/19 0819   12/13/19 0400  levofloxacin (LEVAQUIN) IVPB 500 mg     500 mg 100 mL/hr over 60 Minutes Intravenous Every 24 hours 12/12/19 2316 12/16/19 0359   12/12/19 2330  metroNIDAZOLE (FLAGYL) IVPB 500 mg     500 mg 100 mL/hr over 60 Minutes Intravenous Every 8 hours 12/12/19 2316 12/15/19 2329   12/12/19 1910  tobramycin (NEBCIN) powder  Status:  Discontinued       As needed 12/12/19 1910 12/12/19 2106   12/12/19 1910  vancomycin (VANCOCIN) powder  Status:  Discontinued       As needed 12/12/19 1911 12/12/19 2106   12/11/19 0600  clindamycin (CLEOCIN) IVPB 900 mg  Status:  Discontinued     900 mg 100 mL/hr over 30 Minutes Intravenous Every 8 hours 12/11/19 0352 12/12/19 2316   12/11/19 0400  levofloxacin (LEVAQUIN) IVPB 500 mg  Status:  Discontinued     500 mg 100 mL/hr over 60 Minutes Intravenous Every 24 hours 12/11/19 0352 12/12/19 2316   12/10/19 2045  cefTRIAXone (ROCEPHIN) 2 g in sodium chloride 0.9 % 100 mL IVPB     2 g 200 mL/hr over 30 Minutes Intravenous To Surgery 12/10/19 2043 12/10/19 2123   12/10/19 1630  ceFAZolin (ANCEF) IVPB 2g/100 mL premix     2 g 200 mL/hr over 30 Minutes Intravenous STAT 12/10/19 1619 12/10/19 1659       Assessment/Plan [facility] C7 TP fracture- NSGY c/s (Dr. Franky Macho), c-collar R rib fractures 1-8/10/11 with flail segment of 4-6- pain control, pulm toilet, IS R PTX-CT removed 3/24 with no issues.   R pulmonary contusion- pulm toilet, IS Highly contaminated and Complex soft tissue injury R knee with traumatic arthrotomy, avulsion fracture R patella and impaction fracture R lateral femoral condyle, avulsion fracture R proximal fibula-I&D of this woundby Dr. Carola Frost  3/21, rec'd ancef/Tdap in ED,WBAT in [place]. Repeat I&D by Dr. Carola Frost 3/23.OR 3/25 with Dr. Ulice Bold with excision of skin and soft tissue and placement of Acell and a VAC.  Further plans pending R proximal ulna fracture dislocation, possible open, with associated 10cm hematoma-S/P ORIF ulna and CR radial head by Dr. Carola Frost 3/21, NWB.  May take sling off in bed and in the chair. R 2nd metatarsal, 3rd proximal phalanx fractures-post-op boot per Dr. Carola Frost COVID- asymptomatic, repeat covid test per patient request urinary retention- foley placed on 3/23.  on urecholine and flomax, will DC 3/26 at 0600 and see if he can void ABL anemia - hgb 7.6 today after 1 unit yesterday, recheck in am.  1 unit pRBCs on 3/24 ID- Flagyl and Levaquin per  Dr. Marcelino Scot to stop 3/26 unless plastics wants more EtOH abuse- 269 on admission, CSW c/s FEN-regular diet DVT- SCDs, LMWH Dispo-5W for COVID, PT/OT    LOS: 4 days    Henreitta Cea , [facility] Surgery 12/14/2019, 1:37 PM Please see Amion for pager number during day hours 7:00am-4:30pm or 7:00am -11:30am on weekends

## 2019-12-14 NOTE — Care Management (Addendum)
CM unable to reach pt via phone. Per CM DS - pt's mom has insurance information.   CM attempted to call pt's mother to retrieve [facility] insurance information needed for wound vac/HHRN set up.  CM was unable to leave VM on mother's phone,  CM was able to leave VM on father's phone.    Update:  Pt informed CM that his  father was going to send him the card on his phone.  CM informed [employer] that pt has active insurance and will provide to [employer] once CM receives insurance information.  CM also informed pt that [facility] may not be able to provide wound vac changes secondary to BCBS limited acceptability with covid positive pts.  Pt informed CM that he plans to stay with someone at discharge and that can be taught on dressing changes.   Update:  CM received pt BCBS card.  Information sent to [facility] - pt has been approved for [employer] wound vac - vac will be delivered to pts room tomorrow .  CM will start search for [facility] in the am

## 2019-12-15 LAB — BASIC METABOLIC PANEL
Anion gap: 8 (ref 5–15)
BUN: 13 mg/dL (ref 6–20)
CO2: 29 mmol/L (ref 22–32)
Calcium: 8.5 mg/dL — ABNORMAL LOW (ref 8.9–10.3)
Chloride: 103 mmol/L (ref 98–111)
Creatinine, Ser: 0.62 mg/dL (ref 0.61–1.24)
GFR calc Af Amer: >60 mL/min (ref >60)
GFR calc non Af Amer: >60 mL/min (ref >60)
Glucose, Bld: 104 mg/dL — ABNORMAL HIGH (ref 70–99)
Potassium: 3.5 mmol/L (ref 3.5–5.1)
Sodium: 140 mmol/L (ref 135–145)

## 2019-12-15 LAB — CBC
HCT: 25.5 % — ABNORMAL LOW (ref 39.0–52.0)
Hemoglobin: 8.4 g/dL — ABNORMAL LOW (ref 13.0–17.0)
MCH: 29.2 pg (ref 26.0–34.0)
MCHC: 32.9 g/dL (ref 30.0–36.0)
MCV: 88.5 fL (ref 80.0–100.0)
Platelets: 180 10*3/uL (ref 150–400)
RBC: 2.88 MIL/uL — ABNORMAL LOW (ref 4.22–5.81)
RDW: 12.8 % (ref 11.5–15.5)
WBC: 5.8 10*3/uL (ref 4.0–10.5)
nRBC: 0 % (ref 0.0–0.2)
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
## 2019-12-15 NOTE — Progress Notes (Signed)
Patient ID: Derrick Baker, male   DOB: 25-Dec-1985, 34 y.o.   MRN: [id number]    1 Day Post-Op  Subjective: Having more pain today in his elbow and feels like his arm is a little more swollen.  Voiding well since his foley has been removed.  Feels like he needs to have a BM this morning.  Eating well.  Confirmed COVID + results with him this am.    ROS: See above, otherwise other systems negative  Objective: Vital signs in last 24 hours: Temp:  [97.3 F (36.3 C)-98.9 F (37.2 C)] 98.1 F (36.7 C) (03/26 0430) Pulse Rate:  [76-107] 88 (03/26 0430) Resp:  [13-22] 21 (03/26 0430) BP: (122-138)/(64-93) 123/82 (03/26 0430) SpO2:  [98 %-100 %] 98 % (03/26 0745) Last BM Date: (PTA)  Intake/Output from previous day: 03/25 0701 - 03/26 0700 In: 1991 [P.O.:730; I.V.:661; IV Piggyback:600] Out: 4605 [Urine:4550; Drains:50; Blood:5] Intake/Output this shift: No intake/output data recorded.  PE: Gen: NAD Neck: c-collar in place HEENT: PERRL Heart: regular, mildly tachy Lungs: CTAB, Occlusive dressing in place. Abd: soft, minimally tender, ND, +BS QZ:ESPQZ out and voiding into urinal with no issues.  Urine clear yellow MS: right UE in sling and splint. Moves fingers with normal sensation and good cap refill. RLE in knee immobilizer. Wiggles foot and toes. Normal sensation and good cap refill.Moves LUE and LLE with no issues Neuro: cranial nerves II-XII grossly intact Psych: A&Ox3  Lab Results:  Recent Labs    12/14/19 0436 12/15/19 0552  WBC 5.9 5.8  HGB 7.6* 8.4*  HCT 23.3* 25.5*  PLT 158 180   BMET Recent Labs    12/14/19 0436 12/15/19 0552  NA 139 140  K 3.7 3.5  CL 103 103  CO2 29 29  GLUCOSE 102* 104*  BUN 12 13  CREATININE 0.53* 0.62  CALCIUM 8.1* 8.5*   PT/INR No results for input(s): LABPROT, INR in the last 72 hours. CMP     Component Value Date/Time   NA 140 12/15/2019 0552   K 3.5 12/15/2019 0552   CL 103 12/15/2019 0552   CO2 29  12/15/2019 0552   GLUCOSE 104 (H) 12/15/2019 0552   BUN 13 12/15/2019 0552   CREATININE 0.62 12/15/2019 0552   CALCIUM 8.5 (L) 12/15/2019 0552   PROT 5.5 (L) 12/11/2019 0718   ALBUMIN 3.0 (L) 12/11/2019 0718   AST 257 (H) 12/11/2019 0718   ALT 223 (H) 12/11/2019 0718   ALKPHOS 47 12/11/2019 0718   BILITOT 0.7 12/11/2019 0718   GFRNONAA >60 12/15/2019 0552   GFRAA >60 12/15/2019 0552   Lipase  No results found for: LIPASE     Studies/Results: DG CHEST PORT 1 VIEW  Result Date: 12/13/2019 CLINICAL DATA:  Follow-up chest tube removed EXAM: PORTABLE CHEST 1 VIEW COMPARISON:  12/12/2019 FINDINGS: Pigtail catheter on the right has been removed in the interval. No recurrent pneumothorax is seen. The lungs are clear. Mild elevation of the right hemidiaphragm is noted. No bony abnormality is seen. Multiple right-sided rib fractures are noted. IMPRESSION: No recurrent pneumothorax following chest tube removal. Electronically Signed   By: Alcide Clever M.D.   On: 12/13/2019 15:29    Anti-infectives: Anti-infectives (From admission, onward)   Start     Dose/Rate Route Frequency Ordered Stop   12/14/19 0630  ciprofloxacin (CIPRO) IVPB 400 mg     400 mg 200 mL/hr over 60 Minutes Intravenous On call to O.R. 12/14/19 3007 12/14/19 0819   12/13/19 0400  levofloxacin (LEVAQUIN) IVPB 500 mg     500 mg 100 mL/hr over 60 Minutes Intravenous Every 24 hours 12/12/19 2316 12/15/19 0555   12/12/19 2330  metroNIDAZOLE (FLAGYL) IVPB 500 mg     500 mg 100 mL/hr over 60 Minutes Intravenous Every 8 hours 12/12/19 2316 12/15/19 2329   12/12/19 1910  tobramycin (NEBCIN) powder  Status:  Discontinued       As needed 12/12/19 1910 12/12/19 2106   12/12/19 1910  vancomycin (VANCOCIN) powder  Status:  Discontinued       As needed 12/12/19 1911 12/12/19 2106   12/11/19 0600  clindamycin (CLEOCIN) IVPB 900 mg  Status:  Discontinued     900 mg 100 mL/hr over 30 Minutes Intravenous Every 8 hours 12/11/19 0352  12/12/19 2316   12/11/19 0400  levofloxacin (LEVAQUIN) IVPB 500 mg  Status:  Discontinued     500 mg 100 mL/hr over 60 Minutes Intravenous Every 24 hours 12/11/19 0352 12/12/19 2316   12/10/19 2045  cefTRIAXone (ROCEPHIN) 2 g in sodium chloride 0.9 % 100 mL IVPB     2 g 200 mL/hr over 30 Minutes Intravenous To Surgery 12/10/19 2043 12/10/19 2123   12/10/19 1630  ceFAZolin (ANCEF) IVPB 2g/100 mL premix     2 g 200 mL/hr over 30 Minutes Intravenous STAT 12/10/19 1619 12/10/19 1659       Assessment/Plan [facility] C7 TP fracture- NSGY c/s (Dr. Christella Noa), c-collar R rib fractures 1-8/10/11 with flail segment of 4-6- pain control, pulm toilet, IS R PTX-CT removed 3/24 with no issues.  R pulmonary contusion- pulm toilet, IS Highly contaminated and Complex soft tissue injury R knee with traumatic arthrotomy, avulsion fracture R patella and impaction fracture R lateral femoral condyle, avulsion fracture R proximal fibula-I&D of this woundby Dr. Marcelino Scot 3/21, rec'd ancef/Tdap in ED,WBAT in [place].Repeat I&D by Dr. Marcelino Scot 3/23.OR 3/25 with Dr. Marla Roe with excision of skin and soft tissue and placement of Acell and a VAC.  awaiting to see if can get [facility] for VAC changes, if not may have to DC VAC.  Awaiting therapy recommendations for home given patient will unlikely be able to get [facility]. R proximal ulna fracture dislocation, possible open, with associated 10cm hematoma-S/P ORIF ulna and CR radial head by Dr. Marcelino Scot 3/21, NWB. May take sling off in bed and in the chair. R 2nd metatarsal, 3rd proximal phalanx fractures-post-op boot per Dr. Marcelino Scot COVID- asymptomatic, repeat covid test positive and discussed with patient urinary retention-foley placed on 3/23.removed today and voiding well ABL anemia- hgb 8.6 today 1 unit pRBCs on 3/24 ID- Flagyl and Levaquin per Dr. Marcelino Scot to stop 3/26 unless plastics wants more.  Will plan to stop today EtOH abuse- 269 on admission, CSW c/s FEN-regular  diet DVT- SCDs, LMWH Dispo-5W for COVID, PT/OT.  Awaiting what can be set up for [facility]  CM doubts anyone will accept his BCBS and we will have a hard time getting any assistance for him at home.   LOS: 5 days    Henreitta Cea , [facility] 12/15/2019, 8:42 AM Please see Amion for pager number during day hours 7:00am-4:30pm or 7:00am -11:30am on weekends

## 2019-12-15 NOTE — Plan of Care (Signed)

## 2019-12-16 MED ORDER — METOPROLOL TARTRATE 12.5 MG HALF TABLET
12.5000 mg | ORAL_TABLET | Freq: Once | ORAL | Status: AC
  Administered 2019-12-16: 12.5 mg via ORAL
  Filled 2019-12-16: qty 1

## 2019-12-16 NOTE — Progress Notes (Signed)
Per patient, he is a Cytogeneticist. Patient states that he forgot to notify CM/SW.
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
## 2019-12-17 NOTE — Anesthesia Postprocedure Evaluation (Signed)
Anesthesia Post Note  Patient: Derrick Baker  Procedure(s) Performed: IRRIGATION AND DEBRIDEMENT RIGHT KNEE (Right Leg Lower) APPLICATION OF A-CELL OF EXTREMITY (Right Leg Lower) APPLICATION OF WOUND VAC (Right Leg Lower)     Patient location during evaluation: PACU Anesthesia Type: General Level of consciousness: awake and alert Pain management: pain level controlled Vital Signs Assessment: post-procedure vital signs reviewed and stable Respiratory status: spontaneous breathing, nonlabored ventilation, respiratory function stable and patient connected to nasal cannula oxygen Cardiovascular status: blood pressure returned to baseline and stable Postop Assessment: no apparent nausea or vomiting Anesthetic complications: no    Last Vitals:  Vitals:   12/17/19 1050 12/17/19 1439  BP: 122/76 115/70  Pulse:    Resp: 18   Temp: 36.7 C   SpO2: 99%     Last Pain:  Vitals:   12/17/19 1050  TempSrc: Oral  PainSc:                  Cecile Hearing
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
## 2019-12-17 NOTE — Progress Notes (Signed)
   12/16/19 1940  MEWS Score  Pulse Rate (!) 123  ECG Heart Rate (!) 122  Resp (!) 23  Level of Consciousness Alert  SpO2 96 %  O2 Device Room Air  MEWS Score  MEWS Temp 0  MEWS Systolic 0  MEWS Pulse 2  MEWS RR 1  MEWS LOC 0  MEWS Score 3  MEWS Score Color Yellow  MEWS Assessment  Is this an acute change? Yes  MEWS guidelines implemented *See Row Information* Yellow  Provider Notification  Provider Name/Title Violeta Gelinas, MD Trauma  Date Provider Notified 12/16/19  Time Provider Notified [phone]  Notification Type Page  Notification Reason Other (Comment) (HR maintaining 120s since 1853 while pt. in bed.)  Response See new orders  Date of Provider Response 12/16/19  Time of Provider Response 2000   Patient resting in bed, asymptomatic, no distress noted. BP 131/80. Metoprolol 12.5 mg given PO x1 dose per order. Will continue to monitor.
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
## 2019-12-18 MED ORDER — ACETAMINOPHEN 500 MG PO TABS: 1000.0000 mg | ORAL_TABLET | Freq: Four times a day (QID) | ORAL | 0 refills | Status: AC

## 2019-12-18 MED ORDER — VITAMIN D3 25 MCG PO TABS: 2000.0000 [IU] | ORAL_TABLET | Freq: Two times a day (BID) | ORAL | Status: DC

## 2019-12-18 MED ORDER — OXYCODONE HCL 10 MG PO TABS: 5.0000 mg | ORAL_TABLET | ORAL | 0 refills | Status: DC | PRN

## 2019-12-18 MED ORDER — METHOCARBAMOL 500 MG PO TABS: 1000.0000 mg | ORAL_TABLET | Freq: Three times a day (TID) | ORAL | 0 refills | Status: DC | PRN

## 2019-12-18 MED ORDER — ASCORBIC ACID 1000 MG PO TABS: 1000.0000 mg | ORAL_TABLET | Freq: Every day | ORAL | Status: DC

## 2019-12-18 MED ORDER — DOCUSATE SODIUM 100 MG PO CAPS: 100.0000 mg | ORAL_CAPSULE | Freq: Two times a day (BID) | ORAL | 0 refills | Status: DC

## 2019-12-18 MED ORDER — GABAPENTIN 100 MG PO CAPS: 100.0000 mg | ORAL_CAPSULE | Freq: Three times a day (TID) | ORAL | 0 refills | Status: DC | PRN

## 2019-12-18 MED FILL — oxyCODONE HCL 10 MG TABS: 10 | 5 days supply | Qty: 30 | Fill #0

## 2019-12-18 MED FILL — GABAPENTIN 100 MG CAPSULE: 100 | 10 days supply | Qty: 30 | Fill #0

## 2019-12-18 MED FILL — METHOCARBAMOL 500 MG TABS: 500 | 5 days supply | Qty: 30 | Fill #0
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
## 2019-12-18 NOTE — Discharge Instructions (Addendum)
Orthopaedic Discharge instructions   Nonweightbearing Right upper extremity  Ok to move fingers Do not get splint wet  Wear sling when up moving around [facility] to remove sling when in bed or chair weightbear as tolerated Right leg with knee immobilizer and post op shoe   How to Use a Hard Cervical Collar A hard cervical collar limits the movement of the top part of your spine (cervical spine). The collar holds your head and neck in a straight position. A cervical collar may be used to treat:  A fracture in the neck.  Damage to the ligaments. Ligaments are tissues that connect bones.  Injury to the spinal cord. There are several types of hard cervical collars. Follow the manufacturer's instructions for use. These are general guidelines. What are the risks? Wearing a cervical collar is safe. However, problems may occur, including:  Skin breakdown.  Sores that form due to rubbing or pressure on the skin (pressure ulcers).  Pain.  Difficulty breathing.  Worsening of your condition if the collar is not placed correctly.  Increased risk of inhaling food or liquid into your lungs (aspiration). Supplies needed:  Extra cervical collar and replacement pads.  Ice.  Plastic bag.  Towel.  A watertight covering to put over the collar during bathing, if needed. How to use a cervical collar  Wear the cervical collar as told by your health care provider. Do not remove it unless told to do so by your health care provider.  You may be directed to remove it only when you check your skin and change the pads. While the collar is off: ? Ask another person to assist you if needed. ? Keep your head and neck straight.  Do not bend your neck or turn your head. ? Check your skin daily for red areas. Ask for help or use a mirror to check areas you cannot see. ? After checking your skin, wear the extra cervical collar while cleaning and changing the pads of the other collar.  Change the pads  daily or more often if they become wet or dirty. Keep a clean set of replacement pads.  Do not let hard plastic edges touch your skin. Cover them with a soft pad.  If your cervical collar is not waterproof: ? Do not let it get wet. ? Cover it with a watertight covering when you take a bath or a shower. Follow these instructions at home:   Put ice on the injured area to manage pain, stiffness, and swelling. ? Do not remove your cervical collar unless told to do so by your health care provider. ? Put ice in a plastic bag. ? Place a towel between your skin and the bag or between your cervical collar and the bag. ? Leave the ice on for 20 minutes, 2-3 times a day for the first 2 days after your injury.  Do not drive any vehicle until your health care provider approves.  Keep all follow-up visits as told by your health care provider. This is important. Any delay in getting the care that you need can prevent proper healing of your injury. Contact a health care provider if you have:  Red areas of skin under your cervical collar.  Pain that is not controlled with your medicines. Get help right away if you have:  Numbness or weakness in your arms or legs.  Difficulty breathing. These symptoms may represent a serious problem that is an emergency. Do not wait to see if the symptoms  will go away. Get medical help right away. Call your local emergency services (911 in the [place] Do not drive yourself to the hospital. Summary  A cervical collar is a device that supports the chin and the back of the head. It restricts movement of the neck to prevent more damage after a severe injury.  A cervical collar may be used to treat a fracture in the neck, damage to tissues that hold bones together (ligaments), or injury to the spinal cord.  Wear the cervical collar as told by your health care provider. Ask if you may remove the collar to shower, bathe, or eat, or to put ice on your neck. This  information is not intended to replace advice given to you by your health care provider. Make sure you discuss any questions you have with your health care provider. Document Revised: 06/26/2019 Document Reviewed: 07/30/2017 Elsevier Patient Education  [place].   Rib Fracture  A rib fracture is a break or crack in one of the bones of the ribs. The ribs are like a cage that goes around your upper chest. A broken or cracked rib is often painful, but most do not cause other problems. Most rib fractures usually heal on their own in 1-3 months. Follow these instructions at home: Managing pain, stiffness, and swelling  If directed, apply ice to the injured area. ? Put ice in a plastic bag. ? Place a towel between your skin and the bag. ? Leave the ice on for 20 minutes, 2-3 times a day.  Take over-the-counter and prescription medicines only as told by your doctor. Activity  Avoid activities that cause pain to the injured area. Protect your injured area.  Slowly increase activity as told by your doctor. General instructions  Do deep breathing as told by your doctor. You may be told to: ? Take deep breaths many times a day. ? Cough many times a day while hugging a pillow. ? Use a device (incentive spirometer) to do deep breathing many times a day.  Drink enough fluid to keep your pee (urine) clear or pale yellow.  Do not wear a rib belt or binder. These do not allow you to breathe deeply.  Keep all follow-up visits as told by your doctor. This is important. Contact a doctor if:  You have a fever. Get help right away if:  You have trouble breathing.  You are short of breath.  You cannot stop coughing.  You cough up thick or bloody spit (sputum).  You feel sick to your stomach (nauseous), throw up (vomit), or have belly (abdominal) pain.  Your pain gets worse and medicine does not help. Summary  A rib fracture is a break or crack in one of the bones of the  ribs.  Apply ice to the injured area and take medicines for pain as told by your doctor.  Take deep breaths and cough many times a day. Hug a pillow every time you cough. This information is not intended to replace advice given to you by your health care provider. Make sure you discuss any questions you have with your health care provider. Document Revised: 08/20/2017 Document Reviewed: 12/08/2016 Elsevier Patient Education  [place].   Pneumothorax A pneumothorax is commonly called a collapsed lung. It is a condition in which air leaks from a lung and builds up between the thin layer of tissue that covers the lungs (visceral pleura) and the interior wall of the chest cavity (parietal pleura). The  air gets trapped outside the lung, between the lung and the chest wall (pleural space). The air takes up space and prevents the lung from fully expanding. This condition sometimes occurs suddenly with no apparent cause. The buildup of air may be small or large. A small pneumothorax may go away on its own. A large pneumothorax will require treatment and hospitalization. What are the causes? This condition may be caused by:  Trauma and injury to the chest wall.  Surgery and other medical procedures.  A complication of an underlying lung problem, especially chronic obstructive pulmonary disease (COPD) or emphysema. Sometimes the cause of this condition is not known. What increases the risk? You are more likely to develop this condition if:  You have an underlying lung problem.  You smoke.  You are 34-45 years old, male, tall, and underweight.  You have a personal or family history of pneumothorax.  You have an eating disorder (anorexia nervosa). This condition can also happen quickly, even in people with no history of lung problems. What are the signs or symptoms? Sometimes a pneumothorax will have no symptoms. When symptoms are present, they can include:  Chest  pain.  Shortness of breath.  Increased rate of breathing.  Bluish color to your lips or skin (cyanosis). How is this diagnosed? This condition may be diagnosed by:  A medical history and physical exam.  A chest X-ray, chest CT scan, or ultrasound. How is this treated? Treatment depends on how severe your condition is. The goal of treatment is to remove the extra air and allow your lung to expand back to its normal size.  For a small pneumothorax: ? No treatment may be needed. ? Extra oxygen is sometimes used to make it go away more quickly.  For a large pneumothorax or a pneumothorax that is causing symptoms, a procedure is done to drain the air from your lungs. To do this, a health care provider may use: ? A needle with a syringe. This is used to suck air from a pleural space where no additional leakage is taking place. ? A chest tube. This is used to suck air where there is ongoing leakage into the pleural space. The chest tube may need to remain in place for several days until the air leak has healed.  In more severe cases, surgery may be needed to repair the damage that is causing the leak.  If you have multiple pneumothorax episodes or have an air leak that will not heal, a procedure called a pleurodesis may be done. A medicine is placed in the pleural space to irritate the tissues around the lung so that the lung will stick to the chest wall, seal any leaks, and stop any buildup of air in that space. If you have an underlying lung problem, severe symptoms, or a large pneumothorax you will usually need to stay in the hospital. Follow these instructions at home: Lifestyle  Do not use any products that contain nicotine or tobacco, such as cigarettes and e-cigarettes. These are major risk factors in pneumothorax. If you need help quitting, ask your health care provider.  Do not lift anything that is heavier than 10 lb (4.5 kg), or the limit that your health care provider tells you,  until he or she says that it is safe.  Avoid activities that take a lot of effort (strenuous) for as long as told by your health care provider.  Return to your normal activities as told by your health care provider.  Ask your health care provider what activities are safe for you.  Do not fly in an airplane or scuba dive until your health care provider says it is okay. General instructions  Take over-the-counter and prescription medicines only as told by your health care provider.  If a cough or pain makes it difficult for you to sleep at night, try sleeping in a semi-upright position in a recliner or by using 2 or 3 pillows.  If you had a chest tube and it was removed, ask your health care provider when you can remove the bandage (dressing). While the dressing is in place, do not allow it to get wet.  Keep all follow-up visits as told by your health care provider. This is important. Contact a health care provider if:  You cough up thick mucus (sputum) that is yellow or green in color.  You were treated with a chest tube, and you have redness, increasing pain, or discharge at the site where it was placed. Get help right away if:  You have increasing chest pain or shortness of breath.  You have a cough that will not go away.  You begin coughing up blood.  You have pain that is getting worse or is not controlled with medicines.  The site where your chest tube was located opens up.  You feel air coming out of the site where the chest tube was placed.  You have a fever or persistent symptoms for more than 2-3 days.  You have a fever and your symptoms suddenly get worse. These symptoms may represent a serious problem that is an emergency. Do not wait to see if the symptoms will go away. Get medical help right away. Call your local emergency services (911 in the [place] Do not drive yourself to the hospital. Summary  A pneumothorax, commonly called a collapsed lung, is a condition in  which air leaks from a lung and gets trapped between the lung and the chest wall (pleural space).  The buildup of air may be small or large. A small pneumothorax may go away on its own. A large pneumothorax will require treatment and hospitalization.  Treatment for this condition depends on how severe the pneumothorax is. The goal of treatment is to remove the extra air and allow the lung to expand back to its normal size. This information is not intended to replace advice given to you by your health care provider. Make sure you discuss any questions you have with your health care provider. Document Revised: 08/20/2017 Document Reviewed: 08/16/2017 Elsevier Patient Education  2020 [employer].

## 2019-12-18 NOTE — TOC Progression Note (Addendum)
Transition of Care [facility]) - Progression Note    Patient Details  Name: Derrick Baker MRN: [id number] Date of Birth: 1986-03-15  Transition of Care [facility]) CM/SW Contact  Cherylann Parr, RN Phone Number: 12/18/2019, 8:59 AM  Clinical Narrative:    Pt in agreement with DME ordered/recommended. CM provided choice for DME - pt chose [employer] -agency accepts referral.  Per surgery; pt will go to office for vac changes as HH can not be arranged - surgery to give pt specifics on appt times.  Referral made to outpt PT and OT on [facility].  Pt declined for CM to set up a  follow up PCP appt with a [facility]. Pt educated on how to locate a local PCP within his insurance network. NO other CM needs determined - CM signing off    Expected Discharge Plan: Home w Home Health Services    Expected Discharge Plan and Services Expected Discharge Plan: Home w Home Health Services     Post Acute Care Choice: Durable Medical Equipment, Home Health Living arrangements for the past 2 months: Single Family Home                 DME Arranged: Wheelchair manual, 3-N-1, Tub bench DME Agency: [employer] Date DME Agency Contacted: 12/18/19 Time DME Agency Contacted: [phone] Representative spoke with at DME Agency: Zack HH Arranged: RN HH Agency: Other - See comment([facility]) Date HH Agency Contacted: 12/15/19 Time HH Agency Contacted: [phone] Representative spoke with at [facility] Agency: Okey Regal   Social Determinants of Health (SDOH) Interventions    Readmission Risk Interventions No flowsheet data found.
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
## 2019-12-19 ENCOUNTER — Encounter (INDEPENDENT_AMBULATORY_CARE_PROVIDER_SITE_OTHER): Payer: Self-pay | Admitting: Orthopaedic Surgery

## 2019-12-28 ENCOUNTER — Encounter (HOSPITAL_COMMUNITY): Payer: Self-pay | Admitting: Orthopedic Surgery

## 2019-12-28 ENCOUNTER — Other Ambulatory Visit: Payer: Self-pay
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
## 2019-12-29 ENCOUNTER — Other Ambulatory Visit: Payer: Self-pay

## 2019-12-29 ENCOUNTER — Ambulatory Visit (HOSPITAL_COMMUNITY)
Admission: RE | Admit: 2019-12-29 | Discharge: 2019-12-29 | Disposition: A | Discharge status: Home / Self Care | Payer: BC Managed Care – PPO | Attending: Orthopedic Surgery | Admitting: Orthopedic Surgery

## 2019-12-29 ENCOUNTER — Ambulatory Visit (HOSPITAL_COMMUNITY): Payer: BC Managed Care – PPO

## 2019-12-29 ENCOUNTER — Encounter (HOSPITAL_COMMUNITY)
Admission: RE | Disposition: A | Discharge status: Home / Self Care | Payer: Self-pay | Source: Home / Self Care | Attending: Orthopedic Surgery

## 2019-12-29 ENCOUNTER — Encounter (HOSPITAL_COMMUNITY): Payer: Self-pay | Admitting: Orthopedic Surgery

## 2019-12-29 ENCOUNTER — Ambulatory Visit (HOSPITAL_COMMUNITY): Payer: BC Managed Care – PPO | Admitting: Certified Registered"

## 2019-12-29 DIAGNOSIS — Z880 Allergy status to penicillin: Secondary | ICD-10-CM | POA: Diagnosis not present

## 2019-12-29 DIAGNOSIS — S2241XA Multiple fractures of ribs, right side, initial encounter for closed fracture: Secondary | ICD-10-CM | POA: Diagnosis not present

## 2019-12-29 DIAGNOSIS — Z87891 Personal history of nicotine dependence: Secondary | ICD-10-CM | POA: Insufficient documentation

## 2019-12-29 DIAGNOSIS — S53104A Unspecified dislocation of right ulnohumeral joint, initial encounter: Secondary | ICD-10-CM | POA: Insufficient documentation

## 2019-12-29 DIAGNOSIS — Z419 Encounter for procedure for purposes other than remedying health state, unspecified: Secondary | ICD-10-CM

## 2019-12-29 DIAGNOSIS — Y9389 Activity, other specified: Secondary | ICD-10-CM | POA: Diagnosis not present

## 2019-12-29 DIAGNOSIS — S82091C Other fracture of right patella, initial encounter for open fracture type IIIA, IIIB, or IIIC: Secondary | ICD-10-CM | POA: Diagnosis not present

## 2019-12-29 DIAGNOSIS — S12601A Unspecified nondisplaced fracture of seventh cervical vertebra, initial encounter for closed fracture: Secondary | ICD-10-CM | POA: Insufficient documentation

## 2019-12-29 DIAGNOSIS — G8911 Acute pain due to trauma: Secondary | ICD-10-CM | POA: Insufficient documentation

## 2019-12-29 DIAGNOSIS — K429 Umbilical hernia without obstruction or gangrene: Secondary | ICD-10-CM | POA: Diagnosis not present

## 2019-12-29 DIAGNOSIS — S43131A Dislocation of right acromioclavicular joint, greater than 200% displacement, initial encounter: Secondary | ICD-10-CM | POA: Diagnosis present

## 2019-12-29 DIAGNOSIS — F10129 Alcohol abuse with intoxication, unspecified: Secondary | ICD-10-CM | POA: Diagnosis not present

## 2019-12-29 DIAGNOSIS — S43109A Unspecified dislocation of unspecified acromioclavicular joint, initial encounter: Secondary | ICD-10-CM

## 2019-12-29 DIAGNOSIS — S92901A Unspecified fracture of right foot, initial encounter for closed fracture: Secondary | ICD-10-CM | POA: Diagnosis not present

## 2019-12-29 DIAGNOSIS — U071 COVID-19: Secondary | ICD-10-CM | POA: Insufficient documentation

## 2019-12-29 DIAGNOSIS — S270XXA Traumatic pneumothorax, initial encounter: Secondary | ICD-10-CM | POA: Insufficient documentation

## 2019-12-29 HISTORY — DX: Anemia, unspecified: D64.9

## 2019-12-29 HISTORY — PX: RESECTION DISTAL CLAVICAL: SHX5053

## 2019-12-29 HISTORY — PX: APPLICATION OF WOUND VAC: SHX5189

## 2019-12-29 HISTORY — PX: ACROMIO-CLAVICULAR JOINT REPAIR: SHX5183

## 2019-12-29 LAB — POCT I-STAT, CHEM 8
BUN: 12 mg/dL (ref 6–20)
Calcium, Ion: 1.18 mmol/L (ref 1.15–1.40)
Chloride: 101 mmol/L (ref 98–111)
Creatinine, Ser: 0.7 mg/dL (ref 0.61–1.24)
Glucose, Bld: 102 mg/dL — ABNORMAL HIGH (ref 70–99)
HCT: 33 % — ABNORMAL LOW (ref 39.0–52.0)
Hemoglobin: 11.2 g/dL — ABNORMAL LOW (ref 13.0–17.0)
Potassium: 3.8 mmol/L (ref 3.5–5.1)
Sodium: 137 mmol/L (ref 135–145)
TCO2: 33 mmol/L — ABNORMAL HIGH (ref 22–32)

## 2019-12-29 SURGERY — REPAIR, ACROMIOCLAVICULAR JOINT
Anesthesia: Regional | Site: Shoulder | Laterality: Right

## 2019-12-29 MED ORDER — MIDAZOLAM HCL 2 MG/2ML IJ SOLN: 1.0000 mg | Freq: Once | INTRAMUSCULAR | Status: AC

## 2019-12-29 MED ORDER — FENTANYL CITRATE (PF) 100 MCG/2ML IJ SOLN
INTRAMUSCULAR | Status: DC | PRN
  Administered 2019-12-29: 50 ug via INTRAVENOUS

## 2019-12-29 MED ORDER — LIDOCAINE 2% (20 MG/ML) 5 ML SYRINGE
INTRAMUSCULAR | Status: DC | PRN
  Administered 2019-12-29: 20 mg via INTRAVENOUS

## 2019-12-29 MED ORDER — MIDAZOLAM HCL 5 MG/5ML IJ SOLN
INTRAMUSCULAR | Status: DC | PRN
  Administered 2019-12-29: 2 mg via INTRAVENOUS

## 2019-12-29 MED ORDER — FENTANYL CITRATE (PF) 250 MCG/5ML IJ SOLN
INTRAMUSCULAR | Status: AC
  Filled 2019-12-29: qty 5

## 2019-12-29 MED ORDER — FENTANYL CITRATE (PF) 100 MCG/2ML IJ SOLN: 50.0000 ug | Freq: Once | INTRAMUSCULAR | Status: AC

## 2019-12-29 MED ORDER — DEXMEDETOMIDINE HCL 200 MCG/2ML IV SOLN
INTRAVENOUS | Status: DC | PRN
  Administered 2019-12-29: 12 ug via INTRAVENOUS

## 2019-12-29 MED ORDER — PHENYLEPHRINE HCL-NACL 10-0.9 MG/250ML-% IV SOLN
INTRAVENOUS | Status: DC | PRN
  Administered 2019-12-29: 50 ug/min via INTRAVENOUS

## 2019-12-29 MED ORDER — CEFAZOLIN SODIUM-DEXTROSE 2-4 GM/100ML-% IV SOLN
2.0000 g | INTRAVENOUS | Status: AC
  Administered 2019-12-29: 2 g via INTRAVENOUS
  Filled 2019-12-29: qty 100

## 2019-12-29 MED ORDER — ROCURONIUM BROMIDE 50 MG/5ML IV SOSY
PREFILLED_SYRINGE | INTRAVENOUS | Status: DC | PRN
  Administered 2019-12-29: 40 mg via INTRAVENOUS

## 2019-12-29 MED ORDER — LIDOCAINE 2% (20 MG/ML) 5 ML SYRINGE
INTRAMUSCULAR | Status: AC
  Filled 2019-12-29: qty 5

## 2019-12-29 MED ORDER — PROPOFOL 10 MG/ML IV BOLUS
INTRAVENOUS | Status: DC | PRN
  Administered 2019-12-29: 200 mg via INTRAVENOUS

## 2019-12-29 MED ORDER — MIDAZOLAM HCL 2 MG/2ML IJ SOLN
INTRAMUSCULAR | Status: AC
  Administered 2019-12-29: 1 mg via INTRAVENOUS
  Filled 2019-12-29: qty 2

## 2019-12-29 MED ORDER — PROPOFOL 10 MG/ML IV BOLUS
INTRAVENOUS | Status: AC
  Filled 2019-12-29: qty 20

## 2019-12-29 MED ORDER — 0.9 % SODIUM CHLORIDE (POUR BTL) OPTIME
TOPICAL | Status: DC | PRN
  Administered 2019-12-29: 1000 mL

## 2019-12-29 MED ORDER — FENTANYL CITRATE (PF) 100 MCG/2ML IJ SOLN
INTRAMUSCULAR | Status: AC
  Administered 2019-12-29: 50 ug via INTRAVENOUS
  Filled 2019-12-29: qty 2

## 2019-12-29 MED ORDER — ONDANSETRON HCL 4 MG/2ML IJ SOLN
INTRAMUSCULAR | Status: AC
  Filled 2019-12-29: qty 2

## 2019-12-29 MED ORDER — SUGAMMADEX SODIUM 200 MG/2ML IV SOLN
INTRAVENOUS | Status: DC | PRN
  Administered 2019-12-29: 200 mg via INTRAVENOUS

## 2019-12-29 MED ORDER — SUCCINYLCHOLINE CHLORIDE 200 MG/10ML IV SOSY
PREFILLED_SYRINGE | INTRAVENOUS | Status: DC | PRN
  Administered 2019-12-29: 80 mg via INTRAVENOUS

## 2019-12-29 MED ORDER — DEXAMETHASONE SODIUM PHOSPHATE 10 MG/ML IJ SOLN
INTRAMUSCULAR | Status: DC | PRN
  Administered 2019-12-29: 6 mg via INTRAVENOUS

## 2019-12-29 MED ORDER — LACTATED RINGERS IV SOLN: INTRAVENOUS | Status: DC | PRN

## 2019-12-29 MED ORDER — ONDANSETRON HCL 4 MG/2ML IJ SOLN
INTRAMUSCULAR | Status: DC | PRN
  Administered 2019-12-29: 4 mg via INTRAVENOUS

## 2019-12-29 MED ORDER — MIDAZOLAM HCL 2 MG/2ML IJ SOLN
INTRAMUSCULAR | Status: AC
  Filled 2019-12-29: qty 2

## 2019-12-29 MED ORDER — DEXAMETHASONE SODIUM PHOSPHATE 10 MG/ML IJ SOLN
INTRAMUSCULAR | Status: AC
  Filled 2019-12-29: qty 1

## 2019-12-29 MED ORDER — ROCURONIUM BROMIDE 10 MG/ML (PF) SYRINGE
PREFILLED_SYRINGE | INTRAVENOUS | Status: AC
  Filled 2019-12-29: qty 10

## 2019-12-29 MED ORDER — PHENYLEPHRINE 40 MCG/ML (10ML) SYRINGE FOR IV PUSH (FOR BLOOD PRESSURE SUPPORT)
PREFILLED_SYRINGE | INTRAVENOUS | Status: AC
  Filled 2019-12-29: qty 10

## 2019-12-29 MED ORDER — PHENYLEPHRINE 40 MCG/ML (10ML) SYRINGE FOR IV PUSH (FOR BLOOD PRESSURE SUPPORT)
PREFILLED_SYRINGE | INTRAVENOUS | Status: DC | PRN
  Administered 2019-12-29: 80 ug via INTRAVENOUS

## 2019-12-29 SURGICAL SUPPLY — 76 items
300 mL Canister With Gel for ACTIV.A.C. Therapy System ×5 IMPLANT
BENZOIN TINCTURE PRP APPL 2/3 (GAUZE/BANDAGES/DRESSINGS) ×5 IMPLANT
BRUSH SCRUB EZ PLAIN DRY (MISCELLANEOUS) ×10 IMPLANT
CLOSURE STERI-STRIP 1/4X4 (GAUZE/BANDAGES/DRESSINGS) ×5 IMPLANT
CLOSURE WOUND 1/2 X4 (GAUZE/BANDAGES/DRESSINGS) ×1
COVER SURGICAL LIGHT HANDLE (MISCELLANEOUS) ×10 IMPLANT
COVER WAND RF STERILE (DRAPES) ×5 IMPLANT
DRAPE C-ARM 42X72 X-RAY (DRAPES) ×5 IMPLANT
DRAPE C-ARMOR (DRAPES) ×5 IMPLANT
DRAPE IMP U-DRAPE 54X76 (DRAPES) ×5 IMPLANT
DRAPE INCISE IOBAN 66X45 STRL (DRAPES) ×5 IMPLANT
DRAPE ORTHO SPLIT 77X108 STRL (DRAPES) ×2
DRAPE SURG ORHT 6 SPLT 77X108 (DRAPES) ×3 IMPLANT
DRAPE U-SHAPE 47X51 STRL (DRAPES) ×10 IMPLANT
DRSG CUTIMED SORBACT 7X9 (GAUZE/BANDAGES/DRESSINGS) ×5 IMPLANT
DRSG EMULSION OIL 3X3 NADH (GAUZE/BANDAGES/DRESSINGS) ×10 IMPLANT
DRSG MEPILEX BORDER 4X4 (GAUZE/BANDAGES/DRESSINGS) ×5 IMPLANT
DRSG PAD ABDOMINAL 8X10 ST (GAUZE/BANDAGES/DRESSINGS) ×10 IMPLANT
ELECT CAUTERY BLADE 6.4 (BLADE) IMPLANT
ELECT NEEDLE TIP 2.8 STRL (NEEDLE) ×5 IMPLANT
ELECT REM PT RETURN 9FT ADLT (ELECTROSURGICAL) ×5
ELECTRODE REM PT RTRN 9FT ADLT (ELECTROSURGICAL) ×3 IMPLANT
GAUZE SPONGE 4X4 12PLY STRL (GAUZE/BANDAGES/DRESSINGS) ×5 IMPLANT
GLOVE BIO SURGEON STRL SZ7.5 (GLOVE) ×5 IMPLANT
GLOVE BIO SURGEON STRL SZ8 (GLOVE) ×5 IMPLANT
GLOVE BIOGEL PI IND STRL 7.5 (GLOVE) ×3 IMPLANT
GLOVE BIOGEL PI IND STRL 8 (GLOVE) ×3 IMPLANT
GLOVE BIOGEL PI INDICATOR 7.5 (GLOVE) ×2
GLOVE BIOGEL PI INDICATOR 8 (GLOVE) ×2
GOWN STRL REUS W/ TWL LRG LVL3 (GOWN DISPOSABLE) ×6 IMPLANT
GOWN STRL REUS W/ TWL XL LVL3 (GOWN DISPOSABLE) ×3 IMPLANT
GOWN STRL REUS W/TWL LRG LVL3 (GOWN DISPOSABLE) ×4
GOWN STRL REUS W/TWL XL LVL3 (GOWN DISPOSABLE) ×2
IMPL TIGHTROP W/DRV K-LESS (Anchor) ×3 IMPLANT
IMPLANT TIGHTROPE W/DRV K-LESS (Anchor) ×5 IMPLANT
KIT BASIN OR (CUSTOM PROCEDURE TRAY) ×5 IMPLANT
KIT TURNOVER KIT B (KITS) ×5 IMPLANT
MANIFOLD NEPTUNE II (INSTRUMENTS) ×5 IMPLANT
MATRIX WOUND 3-LAYER 7X10 (Tissue) ×4 IMPLANT
MICROMATRIX 500MG (Tissue) ×5 IMPLANT
NDL SUT 2 .5 CRC MAYO 1.732X (NEEDLE) IMPLANT
NEEDLE HYPO 25GX1X1/2 BEV (NEEDLE) ×5 IMPLANT
NEEDLE MAYO TAPER (NEEDLE)
NS IRRIG 1000ML POUR BTL (IV SOLUTION) ×5 IMPLANT
PACK SHOULDER (CUSTOM PROCEDURE TRAY) ×5 IMPLANT
PACK TOTAL JOINT (CUSTOM PROCEDURE TRAY) ×5 IMPLANT
PACK UNIVERSAL I (CUSTOM PROCEDURE TRAY) ×5 IMPLANT
PAD ARMBOARD 7.5X6 YLW CONV (MISCELLANEOUS) ×10 IMPLANT
SOLUTION PARTIC MCRMTRX 500MG (Tissue) ×3 IMPLANT
SPONGE LAP 18X18 RF (DISPOSABLE) ×10 IMPLANT
STAPLER VISISTAT 35W (STAPLE) IMPLANT
STRIP CLOSURE SKIN 1/2X4 (GAUZE/BANDAGES/DRESSINGS) ×4 IMPLANT
SUCTION FRAZIER HANDLE 10FR (MISCELLANEOUS) ×2
SUCTION TUBE FRAZIER 10FR DISP (MISCELLANEOUS) ×3 IMPLANT
SURGILUBE 2OZ TUBE FLIPTOP (MISCELLANEOUS) ×5 IMPLANT
SUT BONE WAX W31G (SUTURE) ×5 IMPLANT
SUT ETHIBOND 2 OS 4 DA (SUTURE) IMPLANT
SUT ETHIBOND NAB BRD #0 18IN (SUTURE) IMPLANT
SUT ETHILON 3 0 PS 1 (SUTURE) ×5 IMPLANT
SUT ETHILON 4 0 PS 2 18 (SUTURE) IMPLANT
SUT MNCRL AB 4-0 PS2 18 (SUTURE) ×5 IMPLANT
SUT MON AB 4-0 PC3 18 (SUTURE) ×5 IMPLANT
SUT PROLENE 3 0 PS 1 (SUTURE) ×5 IMPLANT
SUT VIC AB 0 CT1 27 (SUTURE) ×2
SUT VIC AB 0 CT1 27XBRD ANBCTR (SUTURE) ×3 IMPLANT
SUT VIC AB 0 CT2 27 (SUTURE) IMPLANT
SUT VIC AB 2-0 CT1 27 (SUTURE) ×2
SUT VIC AB 2-0 CT1 TAPERPNT 27 (SUTURE) ×3 IMPLANT
SUT VIC AB 2-0 CT3 27 (SUTURE) IMPLANT
SUT VIC AB 2-0 FS1 27 (SUTURE) IMPLANT
SYR CONTROL 10ML LL (SYRINGE) ×5 IMPLANT
TOWEL GREEN STERILE (TOWEL DISPOSABLE) ×5 IMPLANT
TOWEL GREEN STERILE FF (TOWEL DISPOSABLE) ×5 IMPLANT
WATER STERILE IRR 1000ML POUR (IV SOLUTION) ×5 IMPLANT
WOUND MATRIX 3-LAYER 7X10 (Tissue) ×1 IMPLANT
YANKAUER SUCT BULB TIP NO VENT (SUCTIONS) ×5 IMPLANT

## 2019-12-29 NOTE — Discharge Instructions (Signed)
[facility] Discharge Instructions   General Discharge Instructions  WEIGHT BEARING STATUS: no lifting with right arm   RANGE OF MOTION/ACTIVITY: gentle shoulder motion as tolerated. Wear sling when not working on shoulder motion. Continue with gentle elbow motion several times a day. No extension of Right elbow against resistance    Wound Care: daily wound care R shoulder starting on 01/01/2020. Clean wound with soap and water only. If there is drainage from the surgical wound you can put a new dressing on otherwise leave open to the air when you do your dressing changes.  Leave the white steri strips in place, they will fall off on their own   Call office with questions or concerns   Discharge Wound Care Instructions  Do NOT apply any ointments, solutions or lotions to pin sites or surgical wounds.  These prevent needed drainage and even though solutions like hydrogen peroxide kill bacteria, they also damage cells lining the pin sites that help fight infection.  Applying lotions or ointments can keep the wounds moist and can cause them to breakdown and open up as well. This can increase the risk for infection. When in doubt call the office.  Surgical incisions should be dressed daily starting on 01/01/2020  If any drainage is noted, use 4x4 gauze and tape or a mepilex type dressing (this is the dressing you had on from the hospital).  Once the incision is completely dry and without drainage, it may be left open to air out.  Showering may begin 36-48 hours later.  Cleaning gently with soap and water.  Diet: as you were eating previously.  Can use over the counter stool softeners and bowel preparations, such as Miralax, to help with bowel movements.  Narcotics can be constipating.  Be sure to drink plenty of fluids  PAIN MEDICATION USE AND EXPECTATIONS  You have likely been given narcotic medications to help control your pain.  After a traumatic event that results in an  fracture (broken bone) with or without surgery, it is ok to use narcotic pain medications to help control one's pain.  We understand that everyone responds to pain differently and each individual patient will be evaluated on a regular basis for the continued need for narcotic medications. Ideally, narcotic medication use should last no more than 6-8 weeks (coinciding with fracture healing).   As a patient it is your responsibility as well to monitor narcotic medication use and report the amount and frequency you use these medications when you come to your office visit.   We would also advise that if you are using narcotic medications, you should take a dose prior to therapy to maximize you participation.  IF YOU ARE ON NARCOTIC MEDICATIONS IT IS NOT PERMISSIBLE TO OPERATE A MOTOR VEHICLE (MOTORCYCLE/CAR/TRUCK/MOPED) OR HEAVY MACHINERY DO NOT MIX NARCOTICS WITH OTHER CNS (CENTRAL NERVOUS SYSTEM) DEPRESSANTS SUCH AS ALCOHOL   STOP SMOKING OR USING NICOTINE PRODUCTS!!!!  As discussed nicotine severely impairs your body's ability to heal surgical and traumatic wounds but also impairs bone healing.  Wounds and bone heal by forming microscopic blood vessels (angiogenesis) and nicotine is a vasoconstrictor (essentially, shrinks blood vessels).  Therefore, if vasoconstriction occurs to these microscopic blood vessels they essentially disappear and are unable to deliver necessary nutrients to the healing tissue.  This is one modifiable factor that you can do to dramatically increase your chances of healing your injury.    (This means no smoking, no nicotine gum, patches, etc)  DO  NOT USE NONSTEROIDAL ANTI-INFLAMMATORY DRUGS (NSAID'S)  Using products such as Advil (ibuprofen), Aleve (naproxen), Motrin (ibuprofen) for additional pain control during fracture healing can delay and/or prevent the healing response.  If you would like to take over the counter (OTC) medication, Tylenol (acetaminophen) is ok.  However,  some narcotic medications that are given for pain control contain acetaminophen as well. Therefore, you should not exceed more than 4000 mg of tylenol in a day if you do not have liver disease.  Also note that there are may OTC medicines, such as cold medicines and allergy medicines that my contain tylenol as well.  If you have any questions about medications and/or interactions please ask your doctor/PA or your pharmacist.      ICE AND ELEVATE INJURED/OPERATIVE EXTREMITY  Using ice and elevating the injured extremity above your heart can help with swelling and pain control.  Icing in a pulsatile fashion, such as 20 minutes on and 20 minutes off, can be followed.    Do not place ice directly on skin. Make sure there is a barrier between to skin and the ice pack.    Using frozen items such as frozen peas works well as the conform nicely to the are that needs to be iced.  USE AN ACE WRAP OR TED HOSE FOR SWELLING CONTROL  In addition to icing and elevation, Ace wraps or TED hose are used to help limit and resolve swelling.  It is recommended to use Ace wraps or TED hose until you are informed to stop.    When using Ace Wraps start the wrapping distally (farthest away from the body) and wrap proximally (closer to the body)   Example: If you had surgery on your leg or thing and you do not have a splint on, start the ace wrap at the toes and work your way up to the thigh        If you had surgery on your upper extremity and do not have a splint on, start the ace wrap at your fingers and work your way up to the upper arm  IF YOU ARE IN A SPLINT OR CAST DO NOT REMOVE IT FOR ANY REASON   If your splint gets wet for any reason please contact the office immediately. You may shower in your splint or cast as long as you keep it dry.  This can be done by wrapping in a cast cover or garbage back (or similar)  Do Not stick any thing down your splint or cast such as pencils, money, or hangers to try and scratch  yourself with.  If you feel itchy take benadryl as prescribed on the bottle for itching  IF YOU ARE IN A CAM BOOT (BLACK BOOT)  You may remove boot periodically. Perform daily dressing changes as noted below.  Wash the liner of the boot regularly and wear a sock when wearing the boot. It is recommended that you sleep in the boot until told otherwise    Call office for the following:  Temperature greater than 101F  Persistent nausea and vomiting  Severe uncontrolled pain  Redness, tenderness, or signs of infection (pain, swelling, redness, odor or green/yellow discharge around the site)  Difficulty breathing, headache or visual disturbances  Hives  Persistent dizziness or light-headedness  Extreme fatigue  Any other questions or concerns you may have after discharge  In an emergency, call 911 or go to an Emergency Department at a nearby hospital    CALL THE OFFICE  WITH ANY QUESTIONS OR CONCERNS: [phone]   VISIT OUR WEBSITE FOR ADDITIONAL INFORMATION: [email or web address]

## 2019-12-29 NOTE — Anesthesia Preprocedure Evaluation (Addendum)
Anesthesia Evaluation  Patient identified by MRN, date of birth, ID band Patient awake    Reviewed: Allergy & Precautions, NPO status , Patient's Chart, lab work & pertinent test results  Airway Mallampati: II  TM Distance: >3 FB Neck ROM: Full    Dental  (+) Teeth Intact, Dental Advisory Given   Pulmonary former smoker,    breath sounds clear to auscultation       Cardiovascular  Rhythm:Regular Rate:Normal     Neuro/Psych    GI/Hepatic   Endo/Other    Renal/GU      Musculoskeletal   Abdominal   Peds  Hematology   Anesthesia Other Findings   Reproductive/Obstetrics                             Anesthesia Physical Anesthesia Plan  ASA: II  Anesthesia Plan: General   Post-op Pain Management:  Regional for Post-op pain   Induction: Intravenous  PONV Risk Score and Plan: Ondansetron and Dexamethasone  Airway Management Planned: Oral ETT  Additional Equipment:   Intra-op Plan:   Post-operative Plan: Extubation in OR  Informed Consent: I have reviewed the patients History and Physical, chart, labs and discussed the procedure including the risks, benefits and alternatives for the proposed anesthesia with the patient or authorized representative who has indicated his/her understanding and acceptance.     Dental advisory given  Plan Discussed with: CRNA and Anesthesiologist  Anesthesia Plan Comments:         Anesthesia Quick Evaluation  

## 2019-12-29 NOTE — H&P (Signed)
Interval H&P Note     [facility] (OT[facility])  There have been no significant changes since discharge from hospital 10 days ago.  Subjective: Patient reports pain as mild.  Severe deformity right shoulder; greater than 300% superior displacement of distal clavicle.  Objective: Current Vitals Blood pressure 107/64, pulse 85, temperature 98.3 F (36.8 C), temperature source Oral, resp. rate 19, height 5\' 9"  (1.753 m), weight 77.1 kg, SpO2 98 %. Vital signs in last 24 hours: Temp:  [98.3 F (36.8 C)] 98.3 F (36.8 C) (04/09 0615) Pulse Rate:  [79-95] 85 (04/09 0735) Resp:  [10-19] 19 (04/09 0735) BP: (107-136)/(64-83) 107/64 (04/09 0735) SpO2:  [95 %-98 %] 98 % (04/09 0735) Weight:  [77.1 kg] 77.1 kg (04/09 0615)  Intake/Output from previous day: No intake/output data recorded.  LABS Recent Labs    12/29/19 0707  HGB 11.2*   Recent Labs    12/29/19 0707  HCT 33.0*   Recent Labs    12/29/19 0707  NA 137  K 3.8  CL 101  BUN 12  CREATININE 0.70  GLUCOSE 102*   No results for input(s): LABPT, INR in the last 72 hours.   Physical Exam RUE Prominent dislocation  Sens  Ax/R/M/U intact  Mot   Ax/ R/ PIN/ M/ AIN/ U intact  Brisk CR RLE Wound vac dressing intact, clean, dry  Edema/ swelling controlled  Sens: DPN, SPN, TN intact  Motor: EHL, FHL, and lessor toe ext and flex all intact grossly  Brisk cap refill, warm to touch  Assessment/Plan: Right AC >300% dislocation for repair today with or without distal clavicle excision depending on intraop findings Also plan for right knee wound vac drsg change under anesthesia with ACELL application--I have discussed and coordinated with Dr. 02/28/20 from Plastic Surgery for this.  I discussed with the patient the risks and benefits of surgery, including the possibility of infection, nerve injury, vessel injury, wound breakdown, arthritis, symptomatic hardware, DVT/ PE, loss of motion, loss of reduction, AC  arthritis, and need for further surgery among others.  He acknowledged these risks and wished to proceed.    Ulice Bold, MD [facility], [facility] [phone]

## 2019-12-29 NOTE — Transfer of Care (Signed)
Immediate Anesthesia Transfer of Care Note  Patient: Derrick Baker  Procedure(s) Performed: ACROMIO-CLAVICULAR JOINT REPAIR (Right Shoulder) RESECTION DISTAL CLAVICAL (Right ) Wound Vac Change (Right Knee)  Patient Location: PACU  Anesthesia Type:General and Regional  Level of Consciousness: awake, oriented and patient cooperative  Airway & Oxygen Therapy: Patient Spontanous Breathing and Patient connected to nasal cannula oxygen  Post-op Assessment: Report given to RN and Post -op Vital signs reviewed and stable  Post vital signs: Reviewed and stable  Last Vitals:  Vitals Value Taken Time  BP    Temp    Pulse    Resp    SpO2      Last Pain:  Vitals:   12/29/19 0735  TempSrc:   PainSc: 0-No pain      Patients Stated Pain Goal: 3 (12/29/19 3234)  Complications: No apparent anesthesia complications

## 2019-12-29 NOTE — Interval H&P Note (Signed)
History and Physical Interval Note:  12/29/2019 8:03 AM  [name]  has presented today for surgery, with the diagnosis of AC separation.  The various methods of treatment have been discussed with the patient and family. After consideration of risks, benefits and other options for treatment, the patient has consented to  Procedure(s): ACROMIO-CLAVICULAR JOINT REPAIR (Right) RESECTION DISTAL CLAVICAL (Right) as a surgical intervention.  The patient's history has been reviewed, patient examined, no change in status, stable for surgery.  I have reviewed the patient's chart and labs.  Questions were answered to the patient's satisfaction.    Pt with intact motor and sensory functions to R UEx. Has outstanding ROM of his R shoulder. Discussed op vs non-op and given his occupation and activity level we have opted for surgical intervention   Outpt procedure    Derrick Baker

## 2019-12-29 NOTE — Anesthesia Postprocedure Evaluation (Signed)
Anesthesia Post Note  Patient: Derrick Baker  Procedure(s) Performed: ACROMIO-CLAVICULAR JOINT REPAIR (Right Shoulder) RESECTION DISTAL CLAVICAL (Right ) Wound Vac Change (Right Knee)     Patient location during evaluation: PACU Anesthesia Type: Regional Level of consciousness: awake and alert Pain management: pain level controlled Vital Signs Assessment: post-procedure vital signs reviewed and stable Respiratory status: spontaneous breathing, nonlabored ventilation, respiratory function stable and patient connected to nasal cannula oxygen Cardiovascular status: blood pressure returned to baseline and stable Postop Assessment: no apparent nausea or vomiting Anesthetic complications: no    Last Vitals:  Vitals:   12/29/19 1045 12/29/19 1115  BP: 123/78 117/78  Pulse: 94 89  Resp: 15 17  Temp: 36.7 C 36.8 C  SpO2: 98% 99%    Last Pain:  Vitals:   12/29/19 1045  TempSrc:   PainSc: 0-No pain                 Katoria Yetman COKER

## 2019-12-29 NOTE — Anesthesia Procedure Notes (Signed)
Procedure Name: Intubation Date/Time: 12/29/2019 8:19 AM Performed by: Julian Reil, CRNA Pre-anesthesia Checklist: Patient identified, Emergency Drugs available, Suction available and Patient being monitored Patient Re-evaluated:Patient Re-evaluated prior to induction Oxygen Delivery Method: Circle system utilized Preoxygenation: Pre-oxygenation with 100% oxygen Induction Type: IV induction Laryngoscope Size: Miller and 3 Grade View: Grade I Tube type: Oral Tube size: 7.5 mm Number of attempts: 1 Airway Equipment and Method: Stylet Placement Confirmation: ETT inserted through vocal cords under direct vision,  positive ETCO2 and breath sounds checked- equal and bilateral Secured at: 23 cm Tube secured with: Tape Dental Injury: Teeth and Oropharynx as per pre-operative assessment  Comments: 4x4s bite block used.
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
## 2020-01-04 ENCOUNTER — Telehealth: Payer: Self-pay | Admitting: Plastic Surgery

## 2020-01-04 ENCOUNTER — Encounter: Payer: Self-pay | Admitting: *Deleted

## 2020-01-04 NOTE — Telephone Encounter (Signed)
Advised patient he can use plain [place] jelly. Patient understood and agreed.

## 2020-01-04 NOTE — Telephone Encounter (Signed)
He can do [place], as long as it is unscented and unflavored.   Please schedule follow up with me next week.

## 2020-01-04 NOTE — Telephone Encounter (Signed)
Patient called to see when and if he needed to follow up with Dr. Ulice Bold after his surgery. Also, he was advised by a nurse that he should use [place] Jelly on his wounds but when he purchased the [employer] it stated on the package not to use on wounds so he wanted to double check this was okay. Please call back to advise.

## 2020-01-04 NOTE — Telephone Encounter (Signed)
Returned patients call. Was not able to leave message on VM.  VM was full.

## 2020-01-09 ENCOUNTER — Telehealth: Payer: Self-pay

## 2020-01-09 NOTE — Telephone Encounter (Signed)
Faxed Op notes with measurements of wound to [place] at [facility]

## 2020-01-12 ENCOUNTER — Encounter: Payer: Self-pay | Admitting: Surgical

## 2020-01-12 ENCOUNTER — Other Ambulatory Visit: Payer: Self-pay

## 2020-01-12 ENCOUNTER — Ambulatory Visit (INDEPENDENT_AMBULATORY_CARE_PROVIDER_SITE_OTHER): Payer: BC Managed Care – PPO | Admitting: Surgical

## 2020-01-12 VITALS — BP 120/69 | HR 92 | Temp 97.7°F | Ht 69.0 in | Wt 160.0 lb

## 2020-01-12 DIAGNOSIS — S8991XD Unspecified injury of right lower leg, subsequent encounter: Secondary | ICD-10-CM

## 2020-01-12 NOTE — Progress Notes (Signed)
The patient is a 34 year old male here for follow-up after excision of right knee wound 10 x 15 cm, placement of ACell and placement of wound VAC dressing on 12/14/2019 by Dr. Ulice Bold.  Patient has been changing wound VAC at home by himself.  He has been doing well with this.   Patient underwent additional procedure with Dr. Carola Frost, orthopedics on 12/29/2019.  Patient underwent repair of AC joint separation in conjunction with debridement of right lower extremity wound, application of ACell powder and sheet, wound VAC dressing change under anesthesia.  Patient reports that he has been doing well with dressing changes.  He has not had any fevers or chills or nausea or vomiting.  On exam it appears that the sorbact mesh has granulation tissue growing within in.  Patient reports that he was worried about this as he noticed it during dressing changes.  The wound bed has good beefy granulation tissue, wound edges are nonerythematous and there is no cellulitic changes.  There is no foul odor.  There is no purulent drainage. The wound today is 12 x 7 x 0.1 cm No sign of any infection.  I was able to remove patient's sorbact mesh today, this was difficult due to the mesh beginning to incorporate granulation tissue.  Patient tolerated this procedure well, although he was in a lot of pain.  Afterwards he felt well and we've reapplied a wound VAC.  More donated ACell was applied to the right side of his knee wound, followed by Adaptic, new VAC sponge and VAC tape.  Good seal was noted.  Recommend removing wound VAC on Tuesday, 01/16/2020, showering and then doing daily dressing changes with Adaptic followed by K-Y jelly, 4 x 4 gauze and Kerlix.  Patient continues to wear immobilizing brace on right knee.  All the patient's questions were answered.  The wound bed has healed nicely and is ready for skin graft.  Will reach out to our scheduling team to have this scheduled.  Patient and I briefly discussed the skin  graft surgery and we can schedule additional follow-up for a preop appointment in person or virtually.  Spent greater than 35 minutes with patient discussing further surgical plan, managing current wound and applying new wound VAC/removing previously placed dressings.

## 2020-01-15 ENCOUNTER — Telehealth: Payer: Self-pay | Admitting: *Deleted

## 2020-01-15 NOTE — Telephone Encounter (Signed)
Faxed orders on (01/12/20) to [facility] for supplies for the patient.  Confirmation received and copy scanned into the chart.//AB/CMA

## 2020-01-17 ENCOUNTER — Telehealth: Payer: Self-pay | Admitting: *Deleted
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
## 2020-01-17 NOTE — Telephone Encounter (Signed)
Received on (11/18/19) via of fax from [facility] Wound Progress Tracking.  Completed and faxed back.  Confirmation received.//AB/CMA
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
## 2020-01-18 ENCOUNTER — Encounter (HOSPITAL_BASED_OUTPATIENT_CLINIC_OR_DEPARTMENT_OTHER): Payer: Self-pay | Admitting: Plastic Surgery

## 2020-01-18 ENCOUNTER — Other Ambulatory Visit: Payer: Self-pay

## 2020-01-19 ENCOUNTER — Telehealth (INDEPENDENT_AMBULATORY_CARE_PROVIDER_SITE_OTHER): Payer: BC Managed Care – PPO | Admitting: Surgical

## 2020-01-19 ENCOUNTER — Encounter: Payer: Self-pay | Admitting: Surgical

## 2020-01-19 DIAGNOSIS — S8991XD Unspecified injury of right lower leg, subsequent encounter: Secondary | ICD-10-CM

## 2020-01-19 MED ORDER — CIPROFLOXACIN HCL 500 MG PO TABS: 500.0000 mg | ORAL_TABLET | Freq: Two times a day (BID) | ORAL | 0 refills | Status: DC

## 2020-01-19 MED ORDER — HYDROCODONE-ACETAMINOPHEN 5-325 MG PO TABS: 1.0000 | ORAL_TABLET | Freq: Four times a day (QID) | ORAL | 0 refills | Status: AC | PRN

## 2020-01-19 MED ORDER — ONDANSETRON HCL 4 MG PO TABS: 4.0000 mg | ORAL_TABLET | Freq: Three times a day (TID) | ORAL | 0 refills | Status: DC | PRN

## 2020-01-25 ENCOUNTER — Ambulatory Visit (HOSPITAL_BASED_OUTPATIENT_CLINIC_OR_DEPARTMENT_OTHER)
Admission: RE | Admit: 2020-01-25 | Discharge: 2020-01-25 | Disposition: A | Discharge status: Home / Self Care | Payer: BC Managed Care – PPO | Attending: Plastic Surgery | Admitting: Plastic Surgery

## 2020-01-25 ENCOUNTER — Encounter (HOSPITAL_BASED_OUTPATIENT_CLINIC_OR_DEPARTMENT_OTHER): Payer: Self-pay | Admitting: Plastic Surgery

## 2020-01-25 ENCOUNTER — Encounter (HOSPITAL_BASED_OUTPATIENT_CLINIC_OR_DEPARTMENT_OTHER)
Admission: RE | Disposition: A | Discharge status: Home / Self Care | Payer: Self-pay | Source: Home / Self Care | Attending: Plastic Surgery

## 2020-01-25 ENCOUNTER — Ambulatory Visit (HOSPITAL_BASED_OUTPATIENT_CLINIC_OR_DEPARTMENT_OTHER): Payer: BC Managed Care – PPO | Admitting: Anesthesiology

## 2020-01-25 ENCOUNTER — Other Ambulatory Visit: Payer: Self-pay

## 2020-01-25 DIAGNOSIS — S81001D Unspecified open wound, right knee, subsequent encounter: Secondary | ICD-10-CM | POA: Diagnosis present

## 2020-01-25 DIAGNOSIS — Z880 Allergy status to penicillin: Secondary | ICD-10-CM | POA: Insufficient documentation

## 2020-01-25 DIAGNOSIS — Z87891 Personal history of nicotine dependence: Secondary | ICD-10-CM | POA: Diagnosis not present

## 2020-01-25 DIAGNOSIS — Z87828 Personal history of other (healed) physical injury and trauma: Secondary | ICD-10-CM | POA: Diagnosis not present

## 2020-01-25 DIAGNOSIS — Z8616 Personal history of COVID-19: Secondary | ICD-10-CM | POA: Diagnosis not present

## 2020-01-25 DIAGNOSIS — S8991XD Unspecified injury of right lower leg, subsequent encounter: Secondary | ICD-10-CM

## 2020-01-25 HISTORY — DX: Rider (driver) (passenger) of other motorcycle injured in unspecified traffic accident, initial encounter: V29.99XA

## 2020-01-25 HISTORY — PX: SKIN SPLIT GRAFT: SHX444

## 2020-01-25 SURGERY — APPLICATION, GRAFT, SKIN, SPLIT-THICKNESS
Anesthesia: General | Site: Knee | Laterality: Right

## 2020-01-25 MED ORDER — BACITRACIN-NEOMYCIN-POLYMYXIN OINTMENT TUBE
TOPICAL_OINTMENT | CUTANEOUS | Status: AC
  Filled 2020-01-25: qty 14.17

## 2020-01-25 MED ORDER — CHLORHEXIDINE GLUCONATE CLOTH 2 % EX PADS: 6.0000 | MEDICATED_PAD | Freq: Once | CUTANEOUS | Status: DC

## 2020-01-25 MED ORDER — DEXAMETHASONE SODIUM PHOSPHATE 10 MG/ML IJ SOLN
INTRAMUSCULAR | Status: AC
  Filled 2020-01-25: qty 1

## 2020-01-25 MED ORDER — BUPIVACAINE-EPINEPHRINE 0.25% -1:200000 IJ SOLN
INTRAMUSCULAR | Status: DC | PRN
  Administered 2020-01-25: 10 mL

## 2020-01-25 MED ORDER — LACTATED RINGERS IV SOLN: INTRAVENOUS | Status: DC

## 2020-01-25 MED ORDER — ONDANSETRON HCL 4 MG/2ML IJ SOLN
INTRAMUSCULAR | Status: DC | PRN
  Administered 2020-01-25: 4 mg via INTRAVENOUS

## 2020-01-25 MED ORDER — KETOROLAC TROMETHAMINE 30 MG/ML IJ SOLN
INTRAMUSCULAR | Status: AC
  Filled 2020-01-25: qty 1

## 2020-01-25 MED ORDER — KETOROLAC TROMETHAMINE 30 MG/ML IJ SOLN
INTRAMUSCULAR | Status: DC | PRN
  Administered 2020-01-25: 30 mg via INTRAVENOUS

## 2020-01-25 MED ORDER — LIDOCAINE 2% (20 MG/ML) 5 ML SYRINGE
INTRAMUSCULAR | Status: AC
  Filled 2020-01-25: qty 5

## 2020-01-25 MED ORDER — LIDOCAINE-EPINEPHRINE 1 %-1:100000 IJ SOLN
INTRAMUSCULAR | Status: DC | PRN
  Administered 2020-01-25: 10 mL

## 2020-01-25 MED ORDER — EPHEDRINE 5 MG/ML INJ
INTRAVENOUS | Status: AC
  Filled 2020-01-25: qty 10

## 2020-01-25 MED ORDER — ACETAMINOPHEN 10 MG/ML IV SOLN
INTRAVENOUS | Status: DC | PRN
  Administered 2020-01-25: 1000 mg via INTRAVENOUS
  Administered 2020-01-25: 500 mg via INTRAVENOUS

## 2020-01-25 MED ORDER — CIPROFLOXACIN IN D5W 400 MG/200ML IV SOLN
INTRAVENOUS | Status: AC
  Filled 2020-01-25: qty 200

## 2020-01-25 MED ORDER — FENTANYL CITRATE (PF) 100 MCG/2ML IJ SOLN
INTRAMUSCULAR | Status: AC
  Filled 2020-01-25: qty 2

## 2020-01-25 MED ORDER — LIDOCAINE HCL (CARDIAC) PF 100 MG/5ML IV SOSY
PREFILLED_SYRINGE | INTRAVENOUS | Status: DC | PRN
  Administered 2020-01-25: 50 mg via INTRAVENOUS

## 2020-01-25 MED ORDER — MIDAZOLAM HCL 2 MG/2ML IJ SOLN
INTRAMUSCULAR | Status: AC
  Filled 2020-01-25: qty 2

## 2020-01-25 MED ORDER — DEXAMETHASONE SODIUM PHOSPHATE 4 MG/ML IJ SOLN
INTRAMUSCULAR | Status: DC | PRN
  Administered 2020-01-25: 10 mg via INTRAVENOUS

## 2020-01-25 MED ORDER — ACETAMINOPHEN 10 MG/ML IV SOLN
INTRAVENOUS | Status: AC
  Filled 2020-01-25: qty 100

## 2020-01-25 MED ORDER — PROPOFOL 10 MG/ML IV BOLUS
INTRAVENOUS | Status: DC | PRN
  Administered 2020-01-25: 200 mg via INTRAVENOUS

## 2020-01-25 MED ORDER — MIDAZOLAM HCL 5 MG/5ML IJ SOLN
INTRAMUSCULAR | Status: DC | PRN
  Administered 2020-01-25: 2 mg via INTRAVENOUS

## 2020-01-25 MED ORDER — PHENYLEPHRINE 40 MCG/ML (10ML) SYRINGE FOR IV PUSH (FOR BLOOD PRESSURE SUPPORT)
PREFILLED_SYRINGE | INTRAVENOUS | Status: AC
  Filled 2020-01-25: qty 10

## 2020-01-25 MED ORDER — ONDANSETRON HCL 4 MG/2ML IJ SOLN
INTRAMUSCULAR | Status: AC
  Filled 2020-01-25: qty 2

## 2020-01-25 MED ORDER — SUCCINYLCHOLINE CHLORIDE 200 MG/10ML IV SOSY
PREFILLED_SYRINGE | INTRAVENOUS | Status: AC
  Filled 2020-01-25: qty 10

## 2020-01-25 MED ORDER — EPHEDRINE SULFATE 50 MG/ML IJ SOLN
INTRAMUSCULAR | Status: DC | PRN
  Administered 2020-01-25: 10 mg via INTRAVENOUS

## 2020-01-25 MED ORDER — ACETAMINOPHEN 500 MG PO TABS: 1000.0000 mg | ORAL_TABLET | Freq: Once | ORAL | Status: DC

## 2020-01-25 MED ORDER — FENTANYL CITRATE (PF) 100 MCG/2ML IJ SOLN
INTRAMUSCULAR | Status: DC | PRN
  Administered 2020-01-25: 100 ug via INTRAVENOUS

## 2020-01-25 MED ORDER — CIPROFLOXACIN IN D5W 400 MG/200ML IV SOLN
400.0000 mg | INTRAVENOUS | Status: AC
  Administered 2020-01-25: 400 mg via INTRAVENOUS

## 2020-01-25 MED ORDER — HYDROMORPHONE HCL 1 MG/ML IJ SOLN: 0.2500 mg | INTRAMUSCULAR | Status: DC | PRN

## 2020-01-25 SURGICAL SUPPLY — 68 items
BAG DECANTER FOR FLEXI CONT (MISCELLANEOUS) IMPLANT
BLADE CLIPPER SURG (BLADE) IMPLANT
BLADE DERMATOME SS (BLADE) ×3 IMPLANT
BLADE HEX COATED 2.75 (ELECTRODE) IMPLANT
BLADE SURG 10 STRL SS (BLADE) ×3 IMPLANT
BLADE SURG 15 STRL LF DISP TIS (BLADE) ×1 IMPLANT
BLADE SURG 15 STRL SS (BLADE) ×2
BNDG ELASTIC 3X5.8 VLCR STR LF (GAUZE/BANDAGES/DRESSINGS) IMPLANT
BNDG ELASTIC 4X5.8 VLCR STR LF (GAUZE/BANDAGES/DRESSINGS) ×6 IMPLANT
BNDG ELASTIC 6X5.8 VLCR STR LF (GAUZE/BANDAGES/DRESSINGS) IMPLANT
BNDG GAUZE ELAST 4 BULKY (GAUZE/BANDAGES/DRESSINGS) ×3 IMPLANT
COTTONBALL LRG STERILE PKG (GAUZE/BANDAGES/DRESSINGS) IMPLANT
COVER BACK TABLE 60X90IN (DRAPES) ×3 IMPLANT
COVER MAYO STAND STRL (DRAPES) ×3 IMPLANT
COVER WAND RF STERILE (DRAPES) IMPLANT
DECANTER SPIKE VIAL GLASS SM (MISCELLANEOUS) IMPLANT
DERMABOND ADVANCED (GAUZE/BANDAGES/DRESSINGS) ×4
DERMABOND ADVANCED .7 DNX12 (GAUZE/BANDAGES/DRESSINGS) ×2 IMPLANT
DERMACARRIERS GRAFT 1 TO 1.5 (DISPOSABLE)
DRAPE INCISE IOBAN 66X45 STRL (DRAPES) ×3 IMPLANT
DRAPE U-SHAPE 76X120 STRL (DRAPES) ×3 IMPLANT
DRSG ADAPTIC 3X8 NADH LF (GAUZE/BANDAGES/DRESSINGS) ×3 IMPLANT
DRSG AQUACEL AG ADV 3.5X 6 (GAUZE/BANDAGES/DRESSINGS) ×3 IMPLANT
DRSG EMULSION OIL 3X3 NADH (GAUZE/BANDAGES/DRESSINGS) IMPLANT
DRSG HYDROCOLLOID 4X4 (GAUZE/BANDAGES/DRESSINGS) IMPLANT
DRSG OPSITE 6X11 MED (GAUZE/BANDAGES/DRESSINGS) IMPLANT
DRSG PAD ABDOMINAL 8X10 ST (GAUZE/BANDAGES/DRESSINGS) ×6 IMPLANT
DRSG TEGADERM 4X10 (GAUZE/BANDAGES/DRESSINGS) IMPLANT
ELECT REM PT RETURN 9FT ADLT (ELECTROSURGICAL) ×3
ELECTRODE REM PT RTRN 9FT ADLT (ELECTROSURGICAL) ×1 IMPLANT
GAUZE SPONGE 4X4 12PLY STRL (GAUZE/BANDAGES/DRESSINGS) IMPLANT
GAUZE XEROFORM 5X9 LF (GAUZE/BANDAGES/DRESSINGS) ×3 IMPLANT
GLOVE BIO SURGEON STRL SZ 6.5 (GLOVE) ×2 IMPLANT
GLOVE BIO SURGEONS STRL SZ 6.5 (GLOVE) ×1
GLOVE BIOGEL PI IND STRL 6.5 (GLOVE) ×1 IMPLANT
GLOVE BIOGEL PI INDICATOR 6.5 (GLOVE) ×2
GOWN STRL REUS W/ TWL LRG LVL3 (GOWN DISPOSABLE) ×2 IMPLANT
GOWN STRL REUS W/TWL LRG LVL3 (GOWN DISPOSABLE) ×4
GRAFT DERMACARRIERS 1 TO 1.5 (DISPOSABLE) IMPLANT
NEEDLE HYPO 25X1 1.5 SAFETY (NEEDLE) ×3 IMPLANT
NS IRRIG 1000ML POUR BTL (IV SOLUTION) ×3 IMPLANT
PADDING CAST ABS 3INX4YD NS (CAST SUPPLIES)
PADDING CAST ABS 4INX4YD NS (CAST SUPPLIES)
PADDING CAST ABS COTTON 3X4 (CAST SUPPLIES) IMPLANT
PADDING CAST ABS COTTON 4X4 ST (CAST SUPPLIES) IMPLANT
PENCIL SMOKE EVACUATOR (MISCELLANEOUS) IMPLANT
SET BASIN DAY SURGERY F.S. (CUSTOM PROCEDURE TRAY) ×3 IMPLANT
SHEET MEDIUM DRAPE 40X70 STRL (DRAPES) ×3 IMPLANT
SPLINT FIBERGLASS 3X35 (CAST SUPPLIES) IMPLANT
SPLINT FIBERGLASS 4X30 (CAST SUPPLIES) IMPLANT
SPONGE LAP 18X18 RF (DISPOSABLE) ×6 IMPLANT
STAPLER VISISTAT 35W (STAPLE) IMPLANT
SURGILUBE 2OZ TUBE FLIPTOP (MISCELLANEOUS) IMPLANT
SUT MON AB 5-0 PS2 18 (SUTURE) IMPLANT
SUT SILK 3 0 SH CR/8 (SUTURE) IMPLANT
SUT SILK 4 0 PS 2 (SUTURE) IMPLANT
SUT VIC AB 5-0 P-3 18X BRD (SUTURE) ×1 IMPLANT
SUT VIC AB 5-0 P3 18 (SUTURE) ×2
SUT VIC AB 5-0 PS2 18 (SUTURE) ×9 IMPLANT
SUT VICRYL 4-0 PS2 18IN ABS (SUTURE) IMPLANT
SYR BULB IRRIG 60ML STRL (SYRINGE) ×3 IMPLANT
SYR CONTROL 10ML LL (SYRINGE) ×3 IMPLANT
TOWEL GREEN STERILE FF (TOWEL DISPOSABLE) ×3 IMPLANT
TRAY DSU PREP LF (CUSTOM PROCEDURE TRAY) ×3 IMPLANT
TUBE CONNECTING 20'X1/4 (TUBING) ×1
TUBE CONNECTING 20X1/4 (TUBING) ×2 IMPLANT
UNDERPAD 30X36 HEAVY ABSORB (UNDERPADS AND DIAPERS) ×3 IMPLANT
YANKAUER SUCT BULB TIP NO VENT (SUCTIONS) IMPLANT

## 2020-01-25 NOTE — Transfer of Care (Signed)
Immediate Anesthesia Transfer of Care Note  Patient: Derrick Baker  Procedure(s) Performed: SKIN GRAFT SPLIT THICKNESS WITH VAC PLACEMENT (Right Knee)  Patient Location: PACU  Anesthesia Type:General  Level of Consciousness: awake, alert , oriented and drowsy  Airway & Oxygen Therapy: Patient Spontanous Breathing and Patient connected to face mask oxygen  Post-op Assessment: Report given to RN and Post -op Vital signs reviewed and stable  Post vital signs: Reviewed and stable  Last Vitals:  Vitals Value Taken Time  BP 122/74 01/25/20 1135  Temp    Pulse 96 01/25/20 1137  Resp 13 01/25/20 1137  SpO2 100 % 01/25/20 1137  Vitals shown include unvalidated device data.  Last Pain:  Vitals:   01/25/20 0943  TempSrc: Oral  PainSc: 1       Patients Stated Pain Goal: 3 (01/25/20 0943)  Complications: No apparent anesthesia complications

## 2020-01-25 NOTE — Interval H&P Note (Signed)
History and Physical Interval Note:  01/25/2020 9:35 AM  Derrick Baker  has presented today for surgery, with the diagnosis of soft injury of right now.  The various methods of treatment have been discussed with the patient and family. After consideration of risks, benefits and other options for treatment, the patient has consented to  Procedure(s) with comments: SKIN GRAFT SPLIT THICKNESS (Right) - 1 hour, please as a surgical intervention.  The patient's history has been reviewed, patient examined, no change in status, stable for surgery.  I have reviewed the patient's chart and labs.  Questions were answered to the patient's satisfaction.     Alena Bills Lorene Samaan

## 2020-01-25 NOTE — Op Note (Signed)
DATE OF OPERATION: 01/25/2020  LOCATION: [facility]  PREOPERATIVE DIAGNOSIS: Right knee wound  POSTOPERATIVE DIAGNOSIS: Same  PROCEDURE:  1. Split thickness skin graft placement to right knee 7 x 12 cm.  2. Excision of 7 x 12 cm hypergranulation tissue right knee. 3. Placement of VAC  SURGEON: Herve Haug Sanger Plez Belton, DO  ASSISTANT: Keenan Bachelor, PA  EBL: 5 cc  CONDITION: Stable  COMPLICATIONS: None  INDICATION: The patient, Derrick Baker, is a 34 y.o. male born on 1985/12/05, is here for treatment of a right knee wound.   PROCEDURE DETAILS:  The patient was seen prior to surgery and marked.  The IV antibiotics were given. The patient was taken to the operating room and given a general anesthetic. A standard time out was performed and all information was confirmed by those in the room. SCD was placed on the left leg.   The right leg was prepped and draped.  The local with epinepherine was injected into the right thigh for the 7 x 12 cm area.  The dermatome was set at 08/999 inch.  The graft was obtained from the right upper thigh.  A sterile aquacel dressing was applied.  The #10 blade was used to excise the hypergranulation tissue of the right knee wound 7 x 12 cm.  Hemostasis was achieved with electrocautery and pressure and local soaked telfa.  The 7 x 12 cm graft was pie crusted and applied to the right knee.  The graft was secured with the 5-0 Vicryl.  The adaptic was secured and then the [facility].  The patient was placed back in the knee blocking splint.  The patient was allowed to wake up and taken to recovery room in stable condition at the end of the case. The family was notified at the end of the case.   The advanced practice practitioner (APP) assisted throughout the case.  The APP was essential in retraction and counter traction when needed to make the case progress smoothly.  This retraction and assistance made it possible to see the tissue plans for the  procedure.  The assistance was needed for blood control, tissue re-approximation and assisted with closure of the incision site.  The 21st Century Cures Act was signed into law in 2016 which includes the topic of electronic health records.  This provides immediate access to information in MyChart.  This includes consultation notes, operative notes, office notes, lab results and pathology reports.  If you have any questions about what you read please let us know at your next visit or call us at the office.  We are right here with you.

## 2020-01-25 NOTE — Discharge Instructions (Addendum)
No Tylenol until 5:30pm if needed. No Ibuprofen until 4:15pm if needed.     INSTRUCTIONS FOR AFTER SURGERY   You will likely have some questions about what to expect following your operation.  The following information will help you and your family understand what to expect when you are discharged from the hospital.  Following these guidelines will help ensure a smooth recovery and reduce risks of complications.  Postoperative instructions include information on: diet, wound care, medications and physical activity.  DIET This surgery does not require a specific diet.  However, I have to mention that the healthier you eat the better your body can start healing. It is important to increasing your protein intake.  This means limiting the foods with added sugar.  Focus on fruits and vegetables and some meat.  If you have any liposuction during your procedure be sure to drink water.  If your urine is bright yellow, then it is concentrated, and you need to drink more water.  As a general rule after surgery, you should have 8 ounces of water every hour while awake.  If you find you are persistently nauseated or unable to take in liquids let us know.  NO TOBACCO USE or EXPOSURE.  This will slow your healing process and increase the risk of a wound.  WOUND CARE Right Knee:  Do not remove splint of VAC device.   Right thigh:  If the dressing gets blood you can change the dressing on the thigh.  Apply vaseline and gauze.  We close your incision to leave the smallest and best-looking scar. No ointment or creams on your incisions until given the go ahead.  Especially not Neosporin (Too many skin reactions with this one).  A few weeks after surgery you can use Mederma and start massaging the scar.  ACTIVITY No heavy lifting until cleared by the doctor.    WORK Everyone returns to work at different times. As a rough guide, most people take at least 3 - 4 weeks off prior to returning to work. If you need  documentation for your job, bring the forms to your postoperative follow up visit.  DRIVING Arrange for someone to bring you home from the hospital.  You may be able to drive a few days after surgery but not while taking any narcotics or valium.  BOWEL MOVEMENTS Constipation can occur after anesthesia and while taking pain medication.  It is important to stay ahead for your comfort.  We recommend taking Milk of Magnesia (2 tablespoons; twice a day) while taking the pain pills.   MEDICATIONS and PAIN CONTROL At your preoperative visit for you history and physical you were given the following medications: 1. An antibiotic: Start this medication when you get home and take according to the instructions on the bottle. 2. Zofran 4 mg:  This is to treat nausea and vomiting.  You can take this every 6 hours as needed and only if needed. 3. Norco (hydrocodone/acetaminophen) 5/325 mg:  This is only to be used after you have taken the motrin or the tylenol. Every 8 hours as needed. Over the counter Medication to take: 4. Ibuprofen (Motrin) 600 mg:  Take this every 6 hours.  If you have additional pain then take 500 mg of the tylenol.  Only take the Norco after you have tried these two. 5. Miralax or stool softener of choice: Take this according to the bottle if you take the [place] Call your surgeon's office if  any of the following occur: . Fever 101 degrees F or greater . Excessive bleeding or fluid from the incision site. . Pain that increases over time without aid from the medications . Redness, warmth, or pus draining from incision sites . Persistent nausea or inability to take in liquids . Severe misshapen area that underwent the operation.    Post Anesthesia Home Care Instructions  Activity: Get plenty of rest for the remainder of the day. A responsible individual must stay with you for 24 hours following the procedure.  For the next 24 hours, DO NOT: -Drive a car -Social worker -Drink alcoholic beverages -Take any medication unless instructed by your physician -Make any legal decisions or sign important papers.  Meals: Start with liquid foods such as gelatin or soup. Progress to regular foods as tolerated. Avoid greasy, spicy, heavy foods. If nausea and/or vomiting occur, drink only clear liquids until the nausea and/or vomiting subsides. Call your physician if vomiting continues.  Special Instructions/Symptoms: Your throat may feel dry or sore from the anesthesia or the breathing tube placed in your throat during surgery. If this causes discomfort, gargle with warm salt water. The discomfort should disappear within 24 hours.  If you had a scopolamine patch placed behind your ear for the management of post- operative nausea and/or vomiting:  1. The medication in the patch is effective for 72 hours, after which it should be removed.  Wrap patch in a tissue and discard in the trash. Wash hands thoroughly with soap and water. 2. You may remove the patch earlier than 72 hours if you experience unpleasant side effects which may include dry mouth, dizziness or visual disturbances. 3. Avoid touching the patch. Wash your hands with soap and water after contact with the patch.

## 2020-01-25 NOTE — Anesthesia Preprocedure Evaluation (Addendum)
Anesthesia Evaluation  Patient identified by MRN, date of birth, ID band Patient awake    Reviewed: Allergy & Precautions, H&P , NPO status , Patient's Chart, lab work & pertinent test results  Airway Mallampati: II  TM Distance: >3 FB Neck ROM: Full    Dental no notable dental hx. (+) Teeth Intact, Dental Advisory Given   Pulmonary neg pulmonary ROS, former smoker,    Pulmonary exam normal breath sounds clear to auscultation       Cardiovascular negative cardio ROS   Rhythm:Regular Rate:Normal     Neuro/Psych negative neurological ROS  negative psych ROS   GI/Hepatic negative GI ROS, Neg liver ROS,   Endo/Other  negative endocrine ROS  Renal/GU negative Renal ROS  negative genitourinary   Musculoskeletal   Abdominal   Peds  Hematology negative hematology ROS (+)   Anesthesia Other Findings   Reproductive/Obstetrics negative OB ROS                            Anesthesia Physical Anesthesia Plan  ASA: I  Anesthesia Plan: General   Post-op Pain Management:    Induction: Intravenous  PONV Risk Score and Plan: 3 and Ondansetron, Dexamethasone and Midazolam  Airway Management Planned: LMA  Additional Equipment:   Intra-op Plan:   Post-operative Plan: Extubation in OR  Informed Consent: I have reviewed the patients History and Physical, chart, labs and discussed the procedure including the risks, benefits and alternatives for the proposed anesthesia with the patient or authorized representative who has indicated his/her understanding and acceptance.     Dental advisory given  Plan Discussed with: CRNA  Anesthesia Plan Comments:         Anesthesia Quick Evaluation

## 2020-01-25 NOTE — Anesthesia Procedure Notes (Addendum)
Procedure Name: LMA Insertion Date/Time: 01/25/2020 10:28 AM Performed by: Ronnette Hila, CRNA Pre-anesthesia Checklist: Patient identified, Emergency Drugs available, Suction available and Patient being monitored Patient Re-evaluated:Patient Re-evaluated prior to induction Oxygen Delivery Method: Circle system utilized Preoxygenation: Pre-oxygenation with 100% oxygen Induction Type: IV induction Ventilation: Mask ventilation without difficulty LMA: LMA inserted LMA Size: 4.0 Number of attempts: 1 Airway Equipment and Method: Bite block Placement Confirmation: positive ETCO2 Tube secured with: Tape Dental Injury: Teeth and Oropharynx as per pre-operative assessment

## 2020-01-25 NOTE — Anesthesia Postprocedure Evaluation (Signed)
Anesthesia Post Note  Patient: Derrick Baker  Procedure(s) Performed: SKIN GRAFT SPLIT THICKNESS WITH VAC PLACEMENT (Right Knee)     Patient location during evaluation: PACU Anesthesia Type: General Level of consciousness: awake and alert Pain management: pain level controlled Vital Signs Assessment: post-procedure vital signs reviewed and stable Respiratory status: spontaneous breathing, nonlabored ventilation and respiratory function stable Cardiovascular status: blood pressure returned to baseline and stable Postop Assessment: no apparent nausea or vomiting Anesthetic complications: no    Last Vitals:  Vitals:   01/25/20 1207 01/25/20 1225  BP: 112/69 114/71  Pulse: 63 72  Resp: 12 18  Temp:  36.6 C  SpO2: 99% 96%    Last Pain:  Vitals:   01/25/20 1225  TempSrc:   PainSc: 0-No pain                 Timothee Gali,W. EDMOND

## 2020-01-26 ENCOUNTER — Encounter: Payer: Self-pay | Admitting: *Deleted

## 2020-02-02 ENCOUNTER — Encounter: Payer: Self-pay | Admitting: Surgical

## 2020-02-02 ENCOUNTER — Telehealth: Payer: Self-pay

## 2020-02-02 ENCOUNTER — Ambulatory Visit (INDEPENDENT_AMBULATORY_CARE_PROVIDER_SITE_OTHER): Payer: BC Managed Care – PPO | Admitting: Surgical

## 2020-02-02 ENCOUNTER — Other Ambulatory Visit: Payer: Self-pay

## 2020-02-02 VITALS — BP 119/77 | HR 88 | Temp 98.4°F | Ht 69.0 in | Wt 150.0 lb

## 2020-02-02 DIAGNOSIS — S8991XD Unspecified injury of right lower leg, subsequent encounter: Secondary | ICD-10-CM

## 2020-02-02 MED ORDER — TRIAMCINOLONE ACETONIDE 0.025 % EX OINT
1.0000 | TOPICAL_OINTMENT | Freq: Two times a day (BID) | CUTANEOUS | 0 refills | Status: DC
Start: 2020-02-02 — End: 2020-03-04

## 2020-02-02 NOTE — Telephone Encounter (Signed)
Faxed [facility] order for: ABD pads, 4x4 guaze, xeroform
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
## 2020-02-06 ENCOUNTER — Telehealth: Payer: Self-pay

## 2020-02-06 NOTE — Telephone Encounter (Signed)
Called [facility] and spoke with Delaney Meigs. Although we received a confirmation the fax went through. They did not receive it. Gave verbal order for 30 day supply from [place], ADB pads, 4x4 guaze xeroform ( all for daily) for a 30 day supply.

## 2020-02-07 ENCOUNTER — Telehealth: Payer: Self-pay | Admitting: Plastic Surgery

## 2020-02-07 NOTE — Telephone Encounter (Signed)
Natalia Leatherwood called from [facility] requesting confirmation on whether patient has wound vac or not. Please call her at [phone] ext [phone].

## 2020-02-07 NOTE — Telephone Encounter (Signed)
Returned Derrick Baker's call. Advised her the wound Vac was placed on 01/25/20, with skin graft to wound 7x12cm. Patient came for office visit on 02/02/20. Wound was taken off during time of visit.

## 2020-02-09 ENCOUNTER — Encounter: Payer: Self-pay | Admitting: Plastic Surgery

## 2020-02-09 ENCOUNTER — Other Ambulatory Visit: Payer: Self-pay

## 2020-02-09 ENCOUNTER — Telehealth: Payer: Self-pay

## 2020-02-09 ENCOUNTER — Ambulatory Visit (INDEPENDENT_AMBULATORY_CARE_PROVIDER_SITE_OTHER): Payer: BC Managed Care – PPO | Admitting: Plastic Surgery

## 2020-02-09 VITALS — BP 113/72 | HR 92 | Temp 98.6°F | Ht 69.0 in | Wt 150.0 lb

## 2020-02-09 DIAGNOSIS — S8991XD Unspecified injury of right lower leg, subsequent encounter: Secondary | ICD-10-CM

## 2020-02-09 NOTE — Telephone Encounter (Signed)
Faxed order to [facility] for Collagen dressing

## 2020-02-09 NOTE — Progress Notes (Signed)
Patient is a 34 year old male here for follow-up after split thickness skin graft to right knee on 01/25/2020 with Dr. Ulice Bold.  He reports overall he is doing well.  Taking only Tylenol for pain as needed.  Skin graft appears to be taking well.  Silver nitrate was applied today to a few areas of hyper granulation.  No signs of infection.  There is no periwound erythema.  Rash from previous visit has resolved.  Denies fever, chest pain, shortness of breath, nausea/vomiting.  Do daily dressing changes consisting of collagen, Kerlix, and Ace wrap.  Wear leg brace 1 to 2 hours/day. Use caution when bending knee. Physical therapy starts on Monday.  Wound measures 11 x 7 x 0.5 cm.   Pictures were obtained of the patient and placed in the chart with the patient's or guardian's permission.  Follow-up in 2 to 3 weeks.  Call office with any questions/concerns.  The 21st Century Cures Act was signed into law in 2016 which includes the topic of electronic health records.  This provides immediate access to information in MyChart.  This includes consultation notes, operative notes, office notes, lab results and pathology reports.  If you have any questions about what you read please let us know at your next visit or call us at the office.  We are right here with you.

## 2020-02-23 ENCOUNTER — Encounter: Payer: Self-pay | Admitting: Plastic Surgery

## 2020-02-23 ENCOUNTER — Ambulatory Visit (INDEPENDENT_AMBULATORY_CARE_PROVIDER_SITE_OTHER): Payer: BC Managed Care – PPO | Admitting: Plastic Surgery

## 2020-02-23 ENCOUNTER — Other Ambulatory Visit: Payer: Self-pay

## 2020-02-23 VITALS — BP 115/81 | HR 77 | Temp 97.7°F | Ht 69.0 in | Wt 150.0 lb

## 2020-02-23 DIAGNOSIS — S8991XD Unspecified injury of right lower leg, subsequent encounter: Secondary | ICD-10-CM

## 2020-02-23 NOTE — Progress Notes (Signed)
   Subjective:    Patient ID: Derrick Baker, male    DOB: 17-Jul-1986, 34 y.o.   MRN: [id number]  Patient is a 34 year old gentleman here for follow-up on his right knee wound.  He underwent debridement followed by a skin graft.  Overall the skin graft very good take for the majority of the knee.  There are 3 areas of hypergranulation.  He has been using collagen at home.  There is no drainage or sign of infection.     Review of Systems  Constitutional: Negative.   HENT: Negative.   Eyes: Negative.   Respiratory: Negative.   Cardiovascular: Negative.   Genitourinary: Negative.   Musculoskeletal: Negative.   Hematological: Negative for adenopathy.  Psychiatric/Behavioral: Negative.        Objective:   Physical Exam Vitals and nursing note reviewed.  Constitutional:      Appearance: Normal appearance.  HENT:     Head: Normocephalic and atraumatic.  Cardiovascular:     Rate and Rhythm: Normal rate.     Pulses: Normal pulses.  Musculoskeletal:       Legs:  Neurological:     General: No focal deficit present.     Mental Status: He is alert. Mental status is at baseline.  Psychiatric:        Mood and Affect: Mood normal.        Behavior: Behavior normal.        Assessment & Plan:     ICD-10-CM   1. Soft tissue injury of right knee, subsequent encounter  S89.91XD   Silver nitrate was used to diminish hyper granulation tissue within 3 area this was 2 cm in total size.  He should continue with the collagen at home.  I would like to see him back in 10 days.  Pictures were obtained of the patient and placed in the chart with the patient's or guardian's permission.

## 2020-03-04 ENCOUNTER — Other Ambulatory Visit: Payer: Self-pay

## 2020-03-04 ENCOUNTER — Ambulatory Visit (INDEPENDENT_AMBULATORY_CARE_PROVIDER_SITE_OTHER): Payer: BC Managed Care – PPO | Admitting: Surgical

## 2020-03-04 ENCOUNTER — Encounter: Payer: Self-pay | Admitting: Surgical

## 2020-03-04 VITALS — BP 125/82 | Temp 98.2°F

## 2020-03-04 DIAGNOSIS — S8991XD Unspecified injury of right lower leg, subsequent encounter: Secondary | ICD-10-CM

## 2020-03-04 NOTE — Progress Notes (Signed)
Patient is a 34 year old male here for follow-up after split-thickness skin graft to his right knee.  He was last seen on 02/23/2020 and had a few areas of hyper granulation that were cauterized using silver nitrate.  He reports that he is doing better today and he has noticed some improvements, specifically in the small wound on the left side of his right knee.  He reports that he has been wrapping it with an Ace bandage instead of Kerlix and has noticed that the increased pressure has been helpful for the wound.  On exam the majority of graft has taken with 3 small areas of exposed granulation tissue.  Medially he has an area that was previously hyper granulated that has some new epithelium and is doing well.  He has continued hypergranulation of the right lateral wound, slight improvement noted.  No erythema, purulent drainage, fluid collections noted.  Continue with collagen dressing changes to granulation tissue.  Silver nitrate was used to cauterize right lateral exposed granulation tissue.  Continue wrapping with Ace bandages, increased pressure will be helpful to prevent/mitigate hyper granulation.  Call with questions or concerns, follow-up in 10 days.  Pictures were obtained of the patient and placed in the chart with the patient's or guardian's permission.

## 2020-03-19 ENCOUNTER — Encounter: Payer: Self-pay | Admitting: Surgical

## 2020-03-19 ENCOUNTER — Other Ambulatory Visit: Payer: Self-pay

## 2020-03-19 ENCOUNTER — Ambulatory Visit (INDEPENDENT_AMBULATORY_CARE_PROVIDER_SITE_OTHER): Payer: BC Managed Care – PPO | Admitting: Surgical

## 2020-03-19 VITALS — BP 127/80 | HR 81 | Temp 98.1°F | Ht 69.0 in | Wt 152.0 lb

## 2020-03-19 DIAGNOSIS — S8991XD Unspecified injury of right lower leg, subsequent encounter: Secondary | ICD-10-CM

## 2020-03-19 NOTE — Progress Notes (Signed)
Patient is a 34 yo male here for follow up on his right knee STSG. He is overall doing well. Continues to work with PT and has noticed some improvements. He is back to work.   He has 2 areas of exposed granulation tissue. Laterally, there is some hypergranulation, but it is improving based on EMR photo review. No periwound erythema. No foul odor. No drainage noted. No purulence. Decreased sensation.  Chemically cauterized the hypergranulated laterally exposed granulation tissue with silver nitrate. Applied xeroform and vaseline. Recommend continuing with collagen dressing changes. Follow up in 2-3 weeks. Call with questions or concerns.  Pictures were obtained of the patient and placed in the chart with the patient's or guardian's permission.

## 2020-04-10 ENCOUNTER — Ambulatory Visit: Payer: BC Managed Care – PPO | Admitting: Surgical

## 2021-04-25 IMAGING — DX DG ELBOW 2V*R*
2 series · 2 of 2 positions shown · non-contrast
Comparison: December 10, 2019.

CLINICAL DATA: Status post right elbow fracture repair.

EXAM:
RIGHT ELBOW - 2 VIEW

[elbow ap]
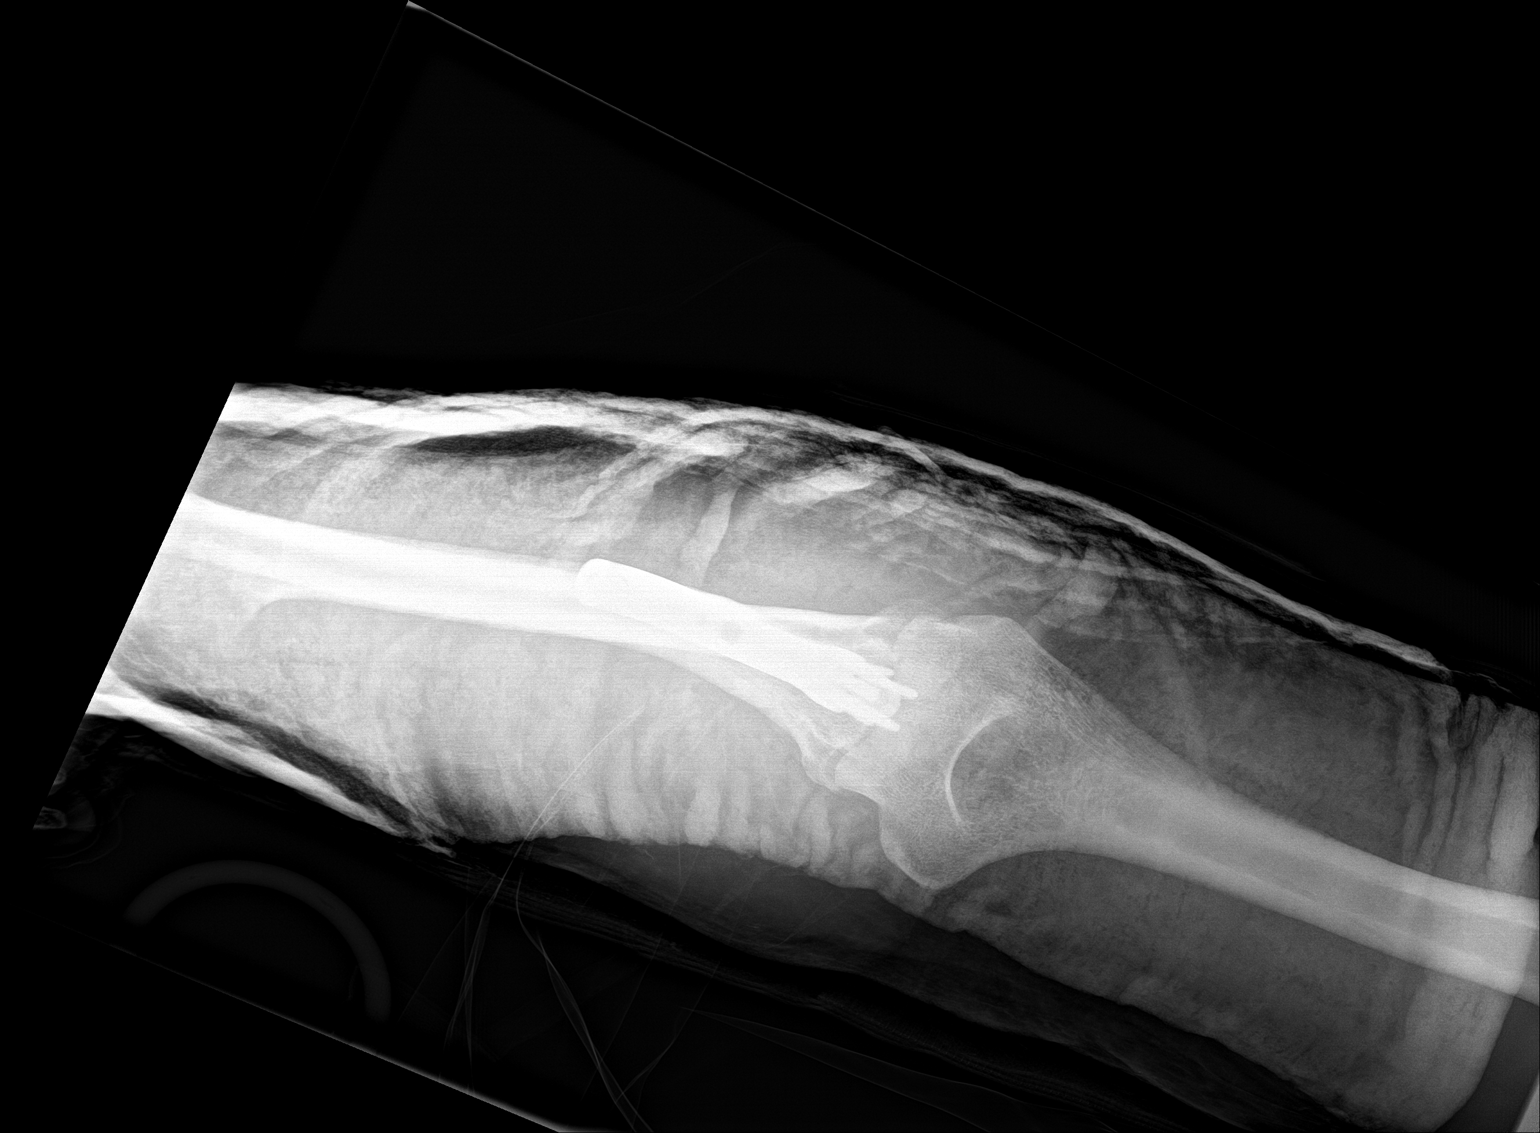

[elbow lat]
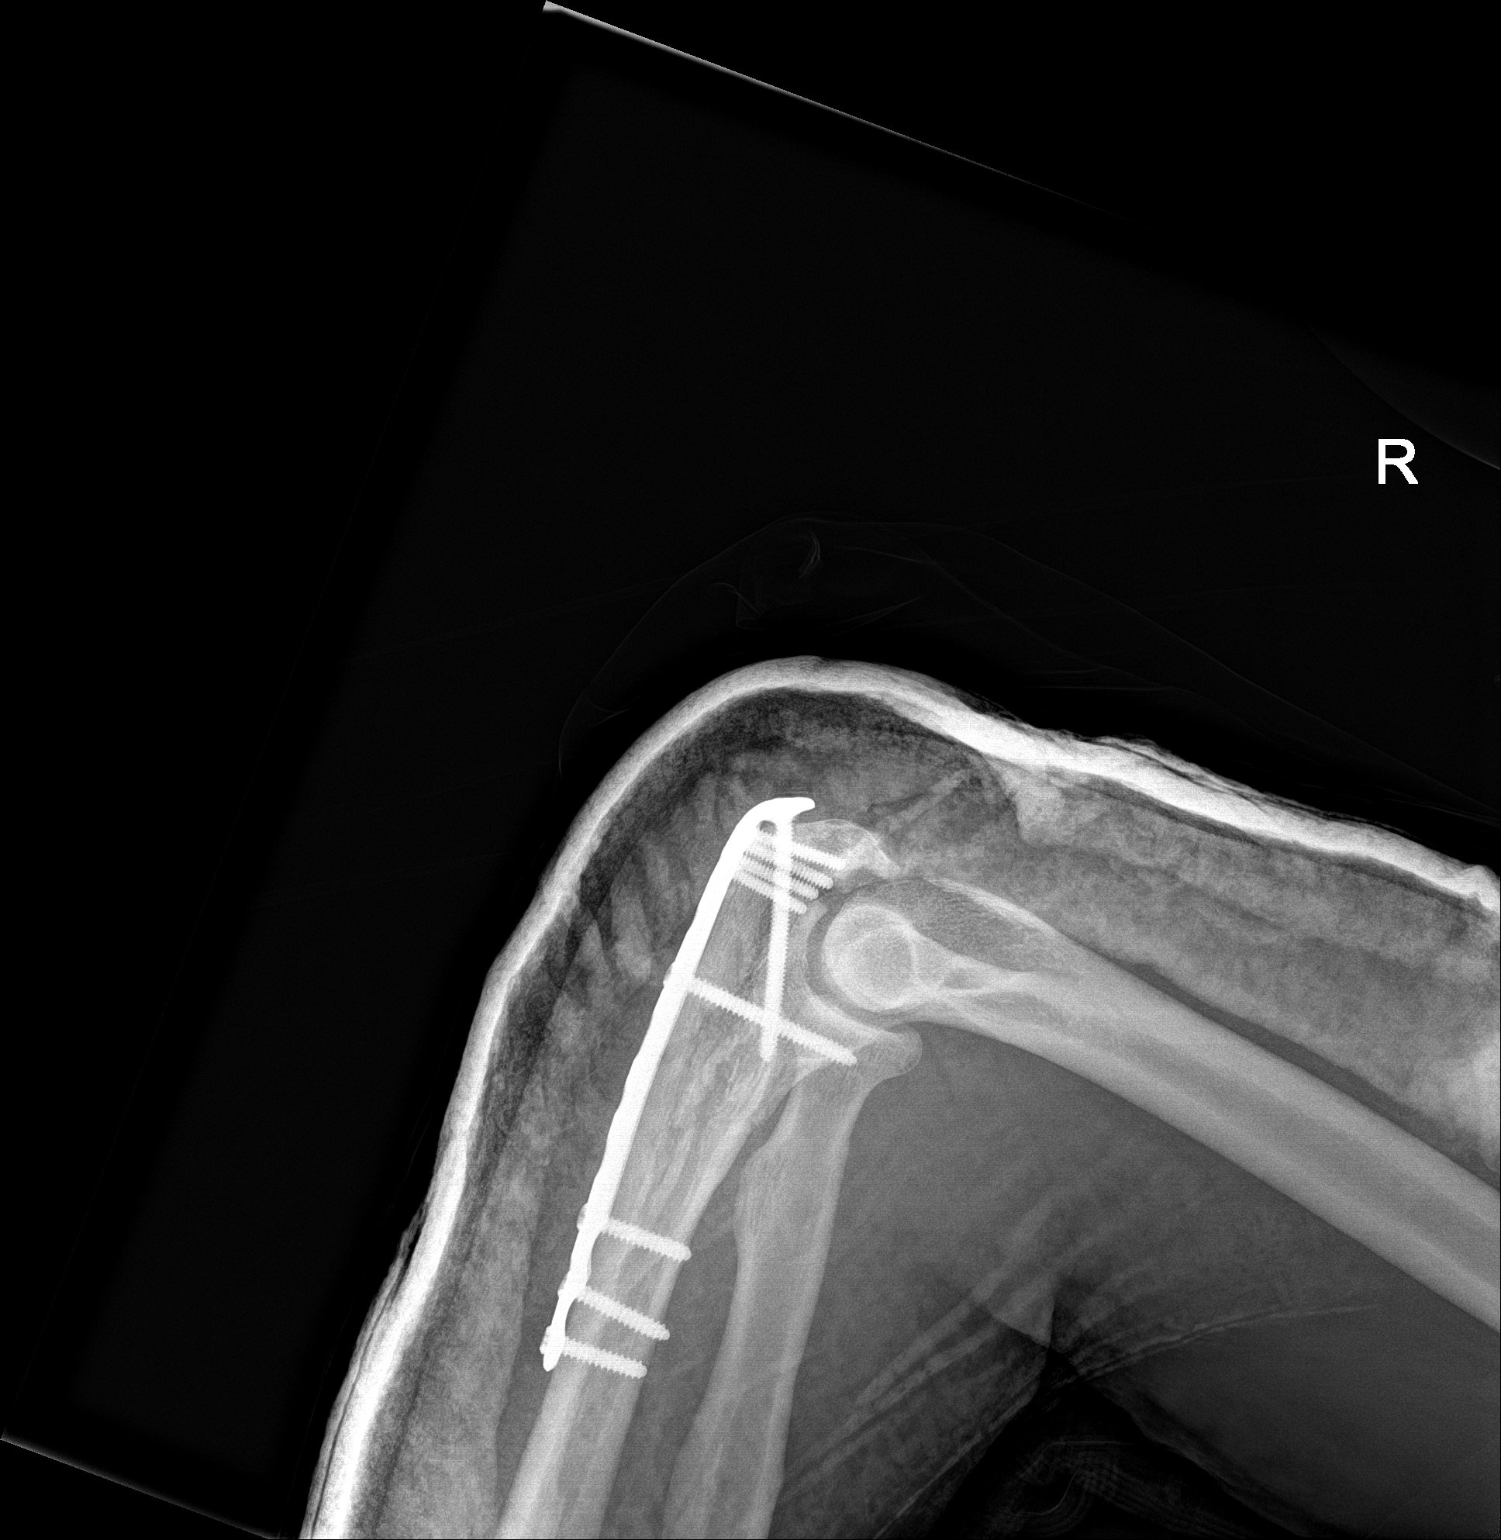

[2 of 2 positions shown; findings below may reference images not displayed]

FINDINGS: Status post surgical internal fixation of fracture involving the
olecranon. Good alignment of fracture components is noted. The right
elbow has been splinted and immobilized.
IMPRESSION: Status post surgical internal fixation of olecranon fracture.

## 2021-04-25 IMAGING — CT CT FOOT*R* W/O CM
1 series · 12 of 14 positions shown, 15 images · non-contrast
Comparison: Radiograph December 10, 2019

CLINICAL DATA: Trauma, fracture, question of Lisfranc injury

EXAM:
CT OF THE RIGHT FOOT WITHOUT CONTRAST
TECHNIQUE: Multidetector CT imaging of the right foot was performed according
to the standard protocol. Multiplanar CT image reconstructions were
also generated.

[Series 4: right foot 3.0 b40s · axial · 0.47mm/px · z∈[+158,+332]mm · 12 of 70 slices shown, 15 images]
[im 6/70  soft-tissue]
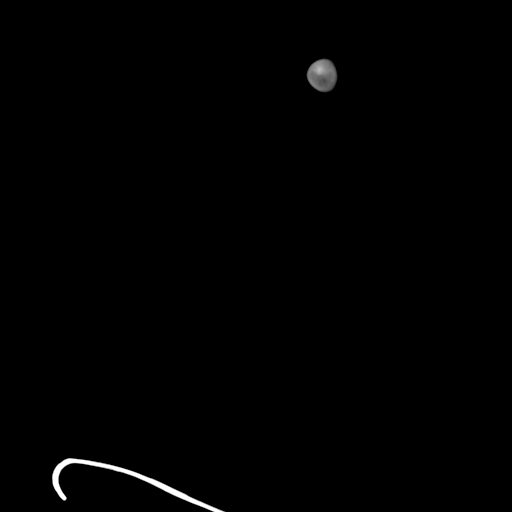
[im 6/70  bone]
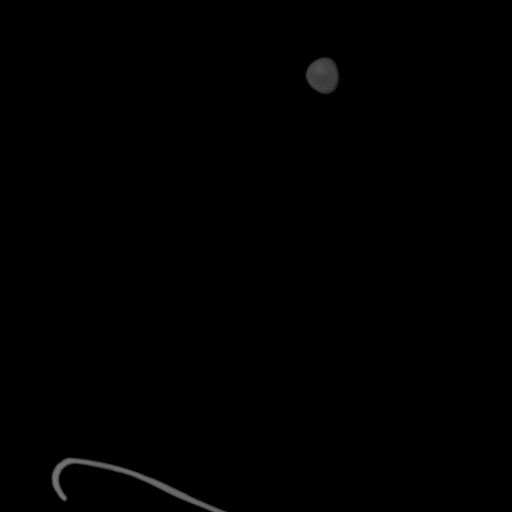
[im 11/70  bone]
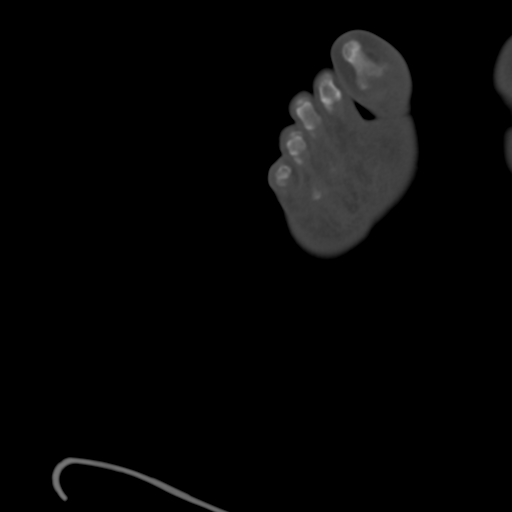
[im 16/70  bone]
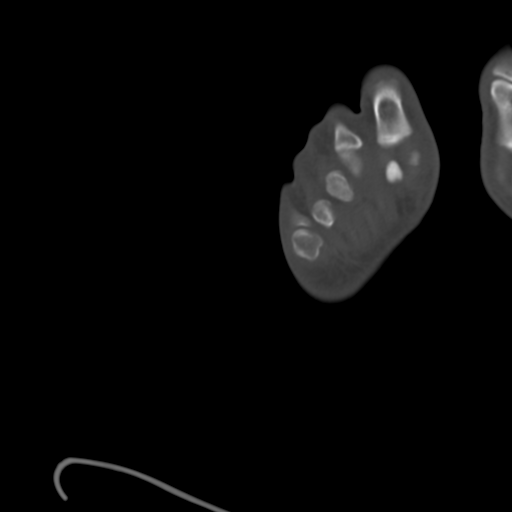
[im 22/70  bone]
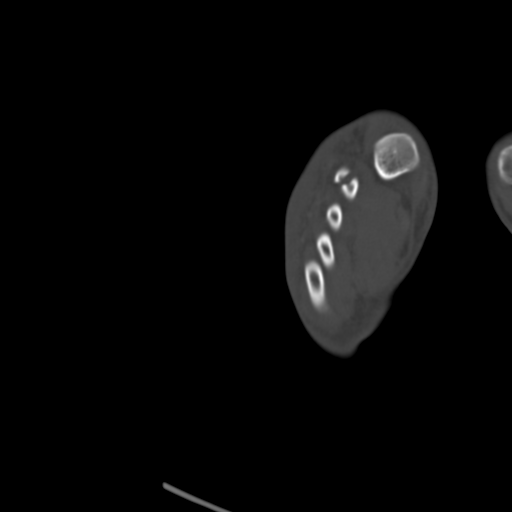
[im 27/70  soft-tissue]
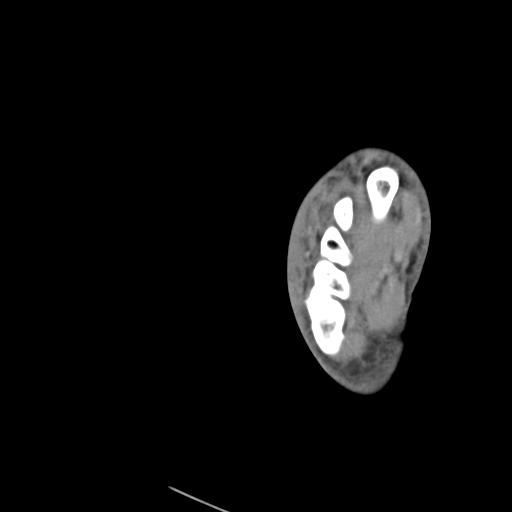
[im 27/70  bone]
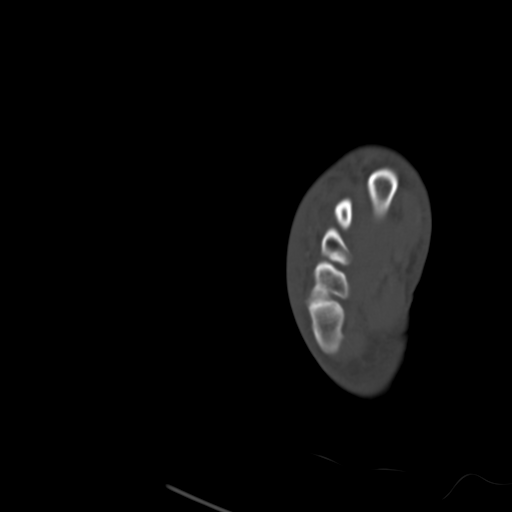
[im 32/70  bone]
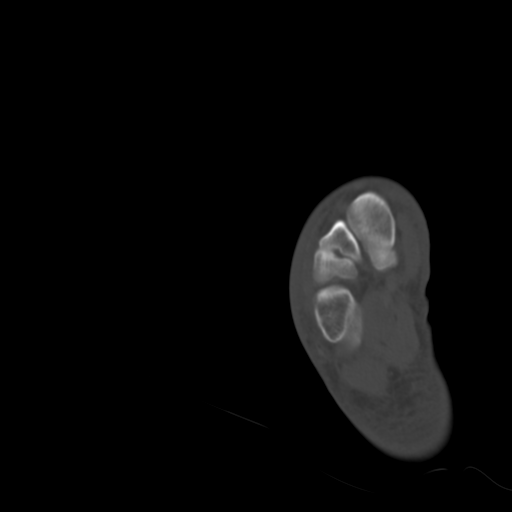
[im 38/70  bone]
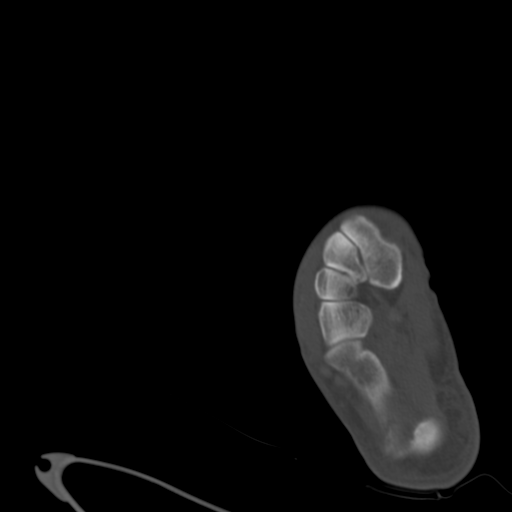
[im 43/70  bone]
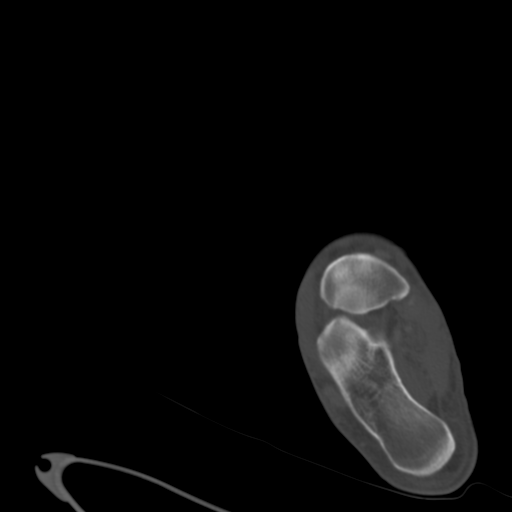
[im 48/70  soft-tissue]
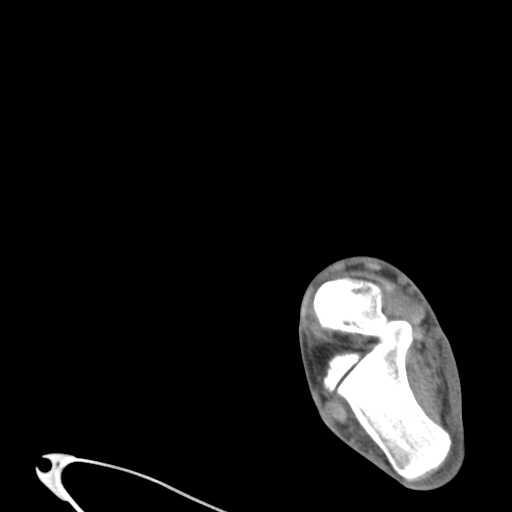
[im 48/70  bone]
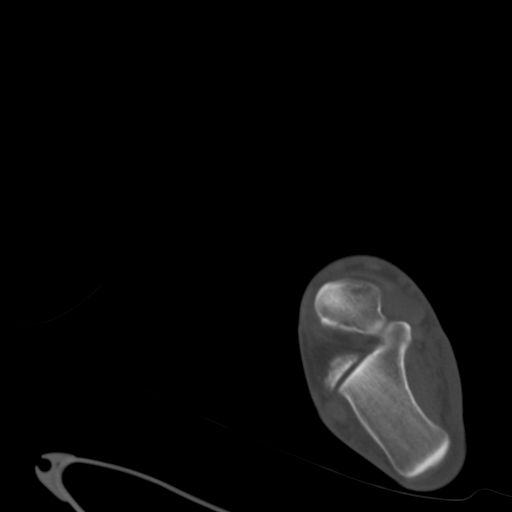
[im 54/70  bone]
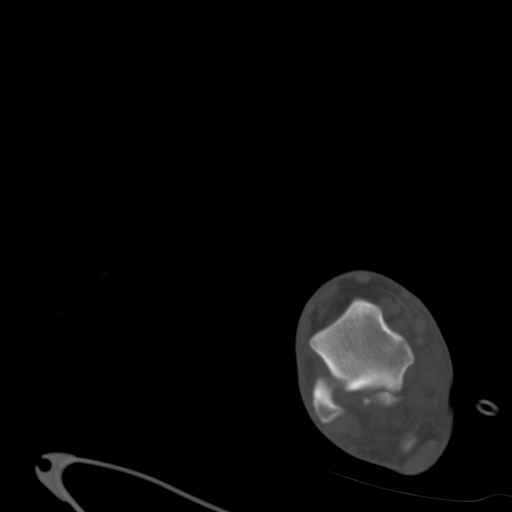
[im 59/70  bone]
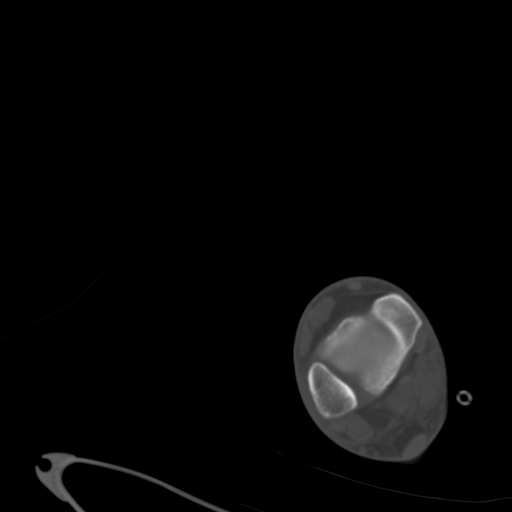
[im 64/70  bone]
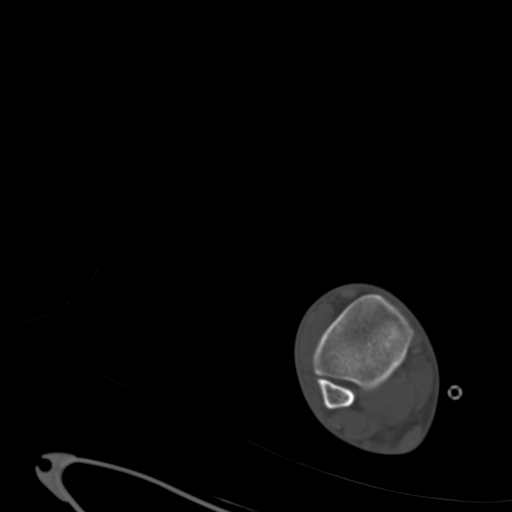

[12 of 14 positions shown; findings below may reference images not displayed]

FINDINGS: Bones/Joint/Cartilage

There is a mildly displaced obliquely oriented fracture seen through
the second distal metatarsal head neck junction. There is also
obliquely oriented mildly displaced fracture seen through the
proximal third phalanx and probable nondisplaced fracture seen at
the plantar base of the third metatarsal head best seen on series 8,
image 31. No widening of the Lisfranc joint spaces is seen.

Ligaments

Suboptimally assessed by CT.

Muscles and Tendons

The muscles surrounding the foot appear to be intact without focal
atrophy or tear. The flexor and extensor tendons are intact. The
plantar fascia is intact.

Soft tissues

There is diffuse extensive soft tissue swelling most notable on the
dorsal surface of the foot.
IMPRESSION: Mild displaced fracture seen through the second metatarsal, third
proximal phalanx and plantar surface of the third metatarsal head.

No evidence of Lisfranc injury.

## 2021-05-13 IMAGING — RF DG C-ARM 1-60 MIN
1 series · 1 of 1 positions shown · non-contrast
Comparison: Intraoperative image obtained earlier in the day

CLINICAL DATA: History of acromioclavicular separation with
postoperative change.

EXAM:
RIGHT CLAVICLE - 2+ VIEWS

[Series 1: run · 1 of 1 slices shown]
[im 1/1]
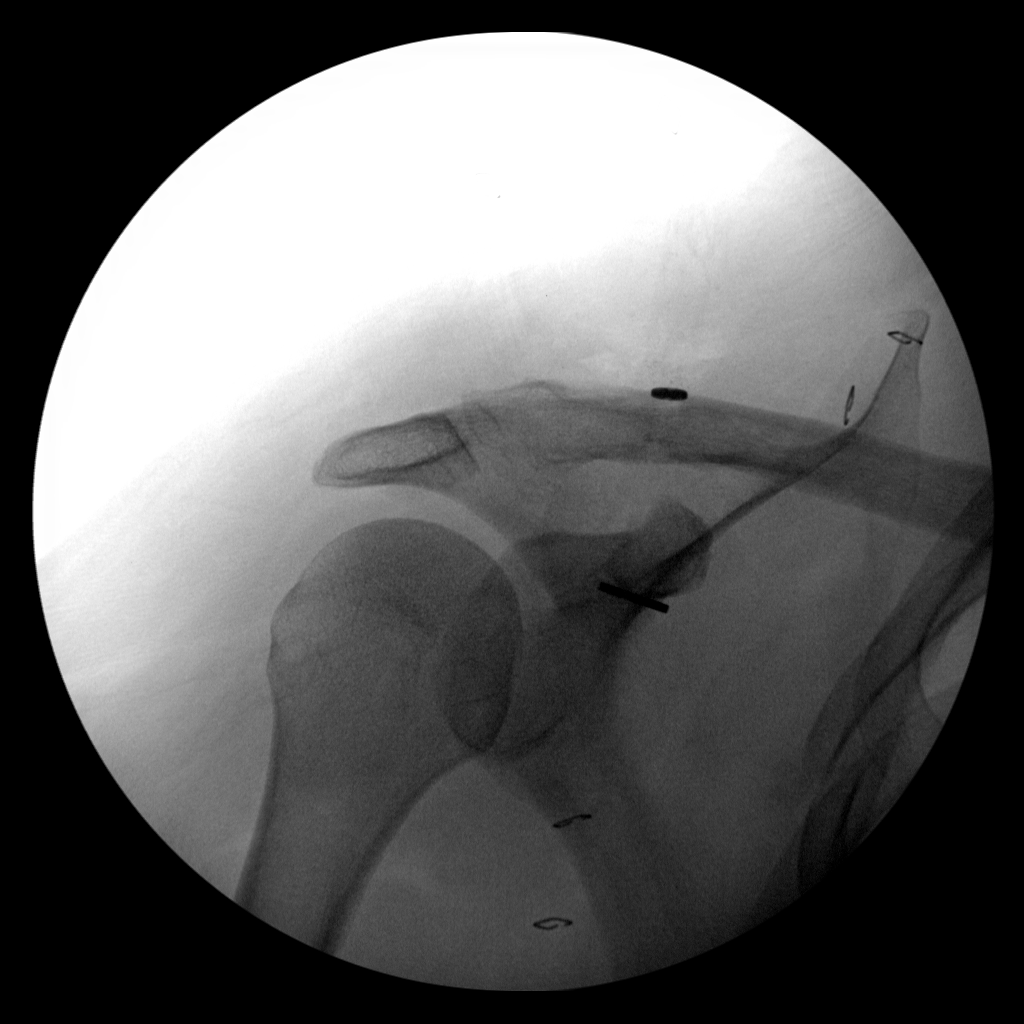

[1 of 1 positions shown; findings below may reference images not displayed]

FINDINGS: Frontal and angled frontal images obtained. Postoperative changes
noted in the acromioclavicular joint region. Currently there is no
fracture or dislocation in the shoulder region. There is no
acromioclavicular or coracoclavicular separation. There is mild
osteoarthritic change in the acromioclavicular joint. No erosive
change.

There are displaced fractures of the right lateral third and fourth
ribs. No pneumothorax evident in the right apex.
IMPRESSION: Postoperative change acromioclavicular joint region. No fracture or
dislocation in the shoulder region. There are, however, mildly
displaced lateral right third and fourth rib fractures. No
pneumothorax evident. Mild osteoarthritic change in the
acromioclavicular joint. Glenohumeral joint appears normal.

## 2021-05-13 IMAGING — DX DG CLAVICLE*R*
1 series · 2 of 2 positions shown · non-contrast
Comparison: Intraoperative image obtained earlier in the day

CLINICAL DATA: History of acromioclavicular separation with
postoperative change.

EXAM:
RIGHT CLAVICLE - 2+ VIEWS

[Series 1: clavicle · 0.14mm/px · 2 of 2 slices shown]
[im 1/2]
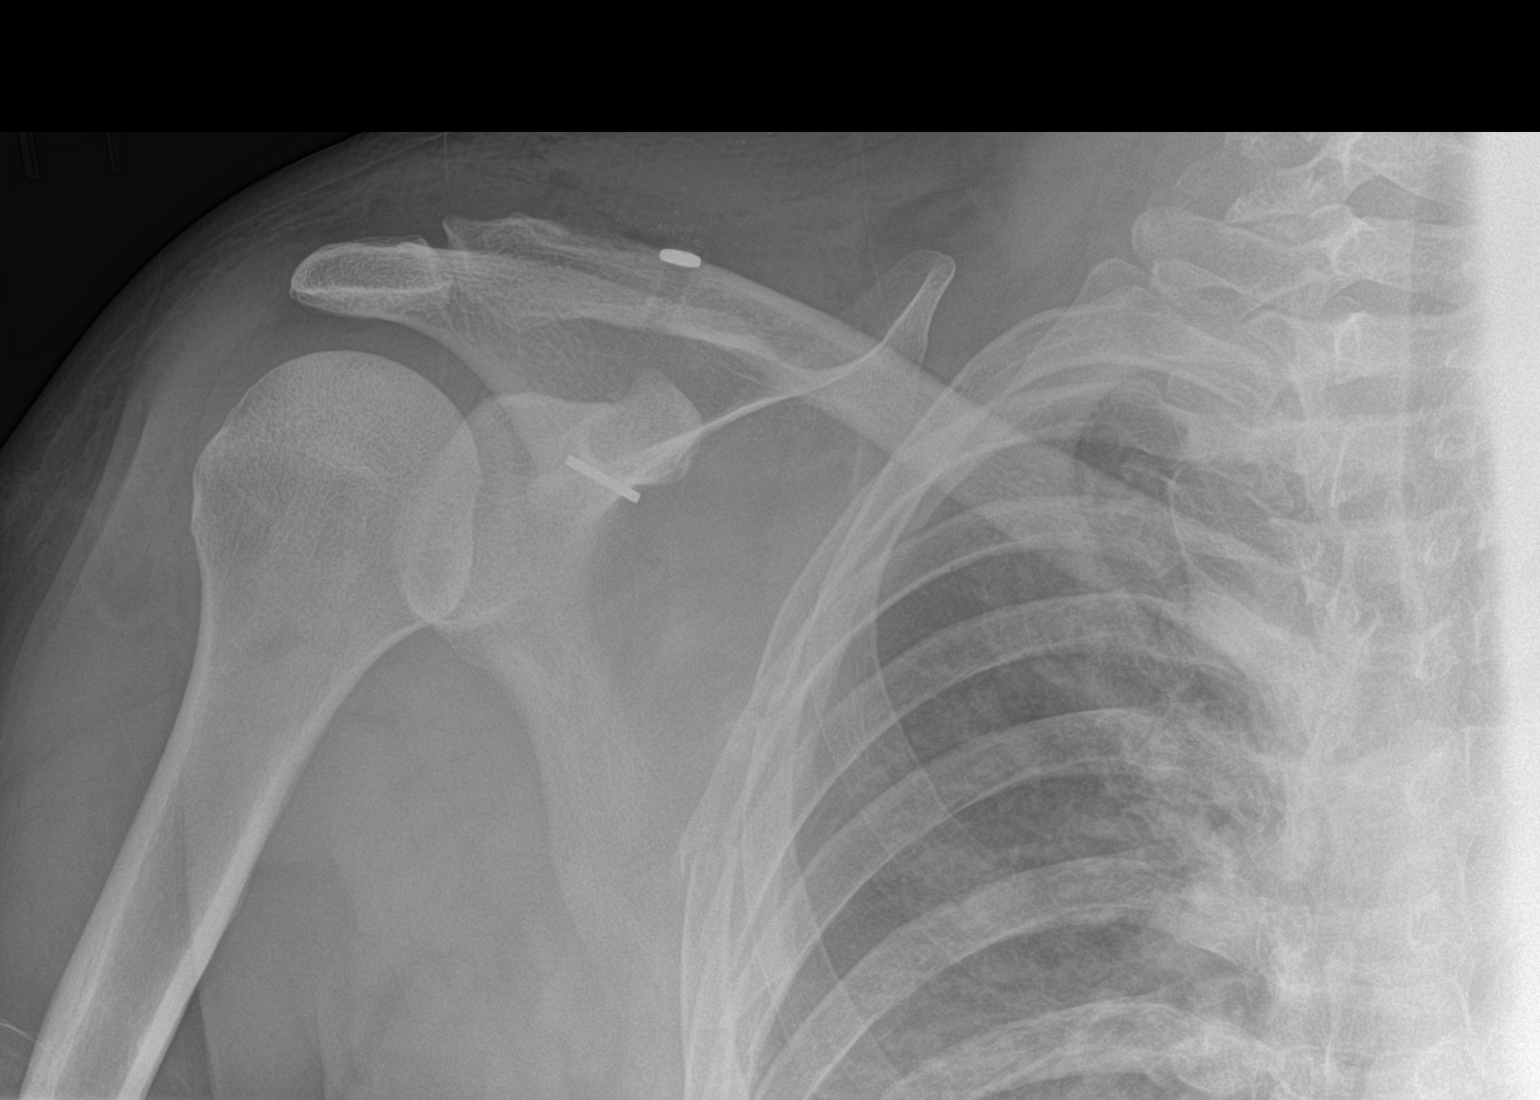
[im 2/2]
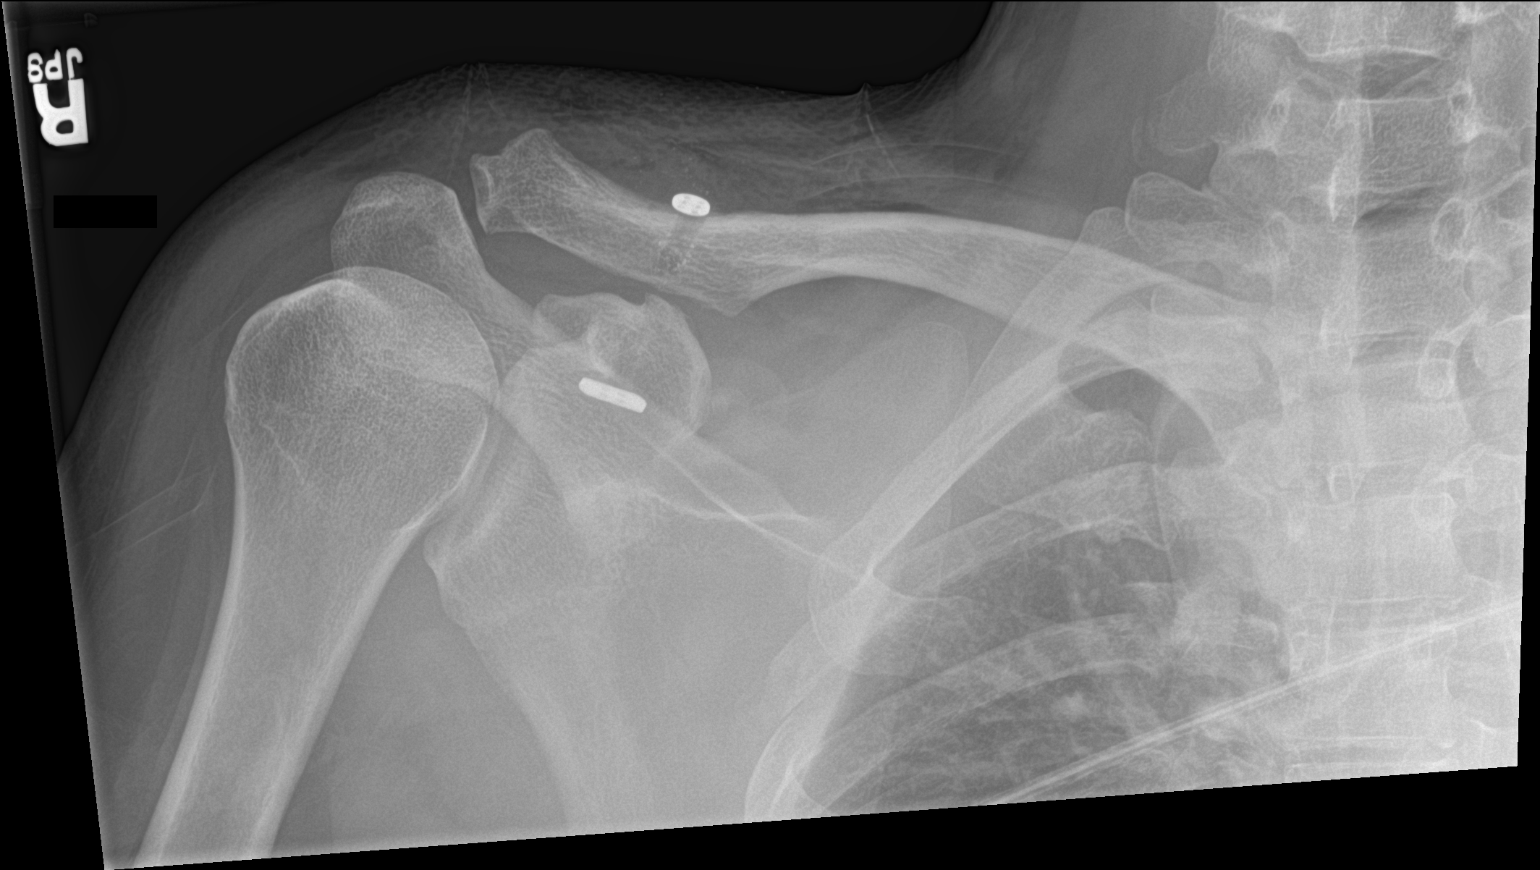

[2 of 2 positions shown; findings below may reference images not displayed]

FINDINGS: Frontal and angled frontal images obtained. Postoperative changes
noted in the acromioclavicular joint region. Currently there is no
fracture or dislocation in the shoulder region. There is no
acromioclavicular or coracoclavicular separation. There is mild
osteoarthritic change in the acromioclavicular joint. No erosive
change.

There are displaced fractures of the right lateral third and fourth
ribs. No pneumothorax evident in the right apex.
IMPRESSION: Postoperative change acromioclavicular joint region. No fracture or
dislocation in the shoulder region. There are, however, mildly
displaced lateral right third and fourth rib fractures. No
pneumothorax evident. Mild osteoarthritic change in the
acromioclavicular joint. Glenohumeral joint appears normal.
